# Patient Record
Sex: Female | Born: 1980 | Race: White | Hispanic: No | Marital: Single | Smoking: Current every day smoker
Health system: Southern US, Community
[De-identification: ages and names within clinical notes are randomized; demographics above are authoritative.]

## PROBLEM LIST (undated history)

## (undated) DIAGNOSIS — R7303 Prediabetes: Secondary | ICD-10-CM

## (undated) DIAGNOSIS — E785 Hyperlipidemia, unspecified: Secondary | ICD-10-CM

## (undated) DIAGNOSIS — I10 Essential (primary) hypertension: Secondary | ICD-10-CM

## (undated) DIAGNOSIS — I219 Acute myocardial infarction, unspecified: Secondary | ICD-10-CM

## (undated) HISTORY — PX: OTHER SURGICAL HISTORY: SHX169

---

## 2007-11-28 ENCOUNTER — Emergency Department: Payer: Self-pay | Admitting: Emergency Medicine

## 2008-02-10 ENCOUNTER — Emergency Department: Payer: Self-pay | Admitting: Emergency Medicine

## 2008-04-26 ENCOUNTER — Emergency Department: Payer: Self-pay | Admitting: Emergency Medicine

## 2008-09-08 ENCOUNTER — Emergency Department: Payer: Self-pay | Admitting: Emergency Medicine

## 2010-12-28 ENCOUNTER — Emergency Department: Payer: Self-pay | Admitting: Emergency Medicine

## 2011-02-14 ENCOUNTER — Emergency Department: Payer: Self-pay | Admitting: Emergency Medicine

## 2012-09-10 ENCOUNTER — Ambulatory Visit: Payer: Self-pay | Admitting: Emergency Medicine

## 2013-02-15 DIAGNOSIS — N63 Unspecified lump in unspecified breast: Secondary | ICD-10-CM | POA: Insufficient documentation

## 2013-02-15 DIAGNOSIS — Z720 Tobacco use: Secondary | ICD-10-CM | POA: Insufficient documentation

## 2013-04-05 DIAGNOSIS — Z30430 Encounter for insertion of intrauterine contraceptive device: Secondary | ICD-10-CM | POA: Insufficient documentation

## 2013-11-08 ENCOUNTER — Emergency Department: Payer: Self-pay | Admitting: Emergency Medicine

## 2015-10-15 DIAGNOSIS — E782 Mixed hyperlipidemia: Secondary | ICD-10-CM | POA: Insufficient documentation

## 2016-06-08 ENCOUNTER — Emergency Department: Payer: No Typology Code available for payment source

## 2016-06-08 ENCOUNTER — Emergency Department
Admission: EM | Admit: 2016-06-08 | Discharge: 2016-06-09 | Disposition: A | Discharge status: Home / Self Care | Payer: No Typology Code available for payment source | Attending: Emergency Medicine | Admitting: Emergency Medicine

## 2016-06-08 ENCOUNTER — Encounter: Payer: Self-pay | Admitting: Emergency Medicine

## 2016-06-08 DIAGNOSIS — Y999 Unspecified external cause status: Secondary | ICD-10-CM | POA: Diagnosis not present

## 2016-06-08 DIAGNOSIS — S199XXA Unspecified injury of neck, initial encounter: Secondary | ICD-10-CM | POA: Diagnosis present

## 2016-06-08 DIAGNOSIS — M542 Cervicalgia: Secondary | ICD-10-CM | POA: Diagnosis not present

## 2016-06-08 DIAGNOSIS — Y939 Activity, unspecified: Secondary | ICD-10-CM | POA: Diagnosis not present

## 2016-06-08 DIAGNOSIS — M549 Dorsalgia, unspecified: Secondary | ICD-10-CM | POA: Insufficient documentation

## 2016-06-08 DIAGNOSIS — Y92410 Unspecified street and highway as the place of occurrence of the external cause: Secondary | ICD-10-CM | POA: Insufficient documentation

## 2016-06-08 MED ORDER — ACETAMINOPHEN 325 MG PO TABS
650.0000 mg | ORAL_TABLET | Freq: Once | ORAL | Status: AC
  Administered 2016-06-08: 650 mg via ORAL
  Filled 2016-06-08: qty 2
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
## 2016-06-09 MED ORDER — CYCLOBENZAPRINE HCL 5 MG PO TABS: 5.0000 mg | ORAL_TABLET | Freq: Three times a day (TID) | ORAL | 0 refills | Status: AC | PRN

## 2016-06-09 MED ORDER — MELOXICAM 7.5 MG PO TABS: 7.5000 mg | ORAL_TABLET | Freq: Every day | ORAL | 1 refills | Status: AC
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
## 2017-07-04 ENCOUNTER — Emergency Department
Admission: EM | Admit: 2017-07-04 | Discharge: 2017-07-04 | Disposition: A | Discharge status: Home / Self Care | Payer: BLUE CROSS/BLUE SHIELD | Attending: Student in an Organized Health Care Education/Training Program | Admitting: Student in an Organized Health Care Education/Training Program

## 2017-07-04 ENCOUNTER — Encounter: Payer: Self-pay | Admitting: Medical Oncology

## 2017-07-04 ENCOUNTER — Emergency Department: Payer: BLUE CROSS/BLUE SHIELD

## 2017-07-04 DIAGNOSIS — R1030 Lower abdominal pain, unspecified: Secondary | ICD-10-CM | POA: Diagnosis present

## 2017-07-04 DIAGNOSIS — K529 Noninfective gastroenteritis and colitis, unspecified: Secondary | ICD-10-CM | POA: Diagnosis not present

## 2017-07-04 DIAGNOSIS — R55 Syncope and collapse: Secondary | ICD-10-CM | POA: Diagnosis not present

## 2017-07-04 LAB — COMPREHENSIVE METABOLIC PANEL
ALBUMIN: 4.6 g/dL (ref 3.5–5.0)
ALT: 15 U/L (ref 14–54)
ANION GAP: 6 (ref 5–15)
AST: 20 U/L (ref 15–41)
Alkaline Phosphatase: 53 U/L (ref 38–126)
BUN: 12 mg/dL (ref 6–20)
CALCIUM: 8.9 mg/dL (ref 8.9–10.3)
CO2: 25 mmol/L (ref 22–32)
Chloride: 103 mmol/L (ref 101–111)
Creatinine, Ser: 0.83 mg/dL (ref 0.44–1.00)
GFR calc Af Amer: >60 mL/min (ref >60)
GFR calc non Af Amer: >60 mL/min (ref >60)
GLUCOSE: 147 mg/dL — AB (ref 65–99)
POTASSIUM: 4.1 mmol/L (ref 3.5–5.1)
SODIUM: 134 mmol/L — AB (ref 135–145)
TOTAL PROTEIN: 7.4 g/dL (ref 6.5–8.1)
Total Bilirubin: 0.5 mg/dL (ref 0.3–1.2)

## 2017-07-04 LAB — URINALYSIS, COMPLETE (UACMP) WITH MICROSCOPIC
BILIRUBIN URINE: NEGATIVE
Glucose, UA: NEGATIVE mg/dL
Hgb urine dipstick: NEGATIVE
KETONES UR: NEGATIVE mg/dL
Leukocytes, UA: NEGATIVE
NITRITE: NEGATIVE
PH: 6 (ref 5.0–8.0)
Protein, ur: NEGATIVE mg/dL
Specific Gravity, Urine: 1.011 (ref 1.005–1.030)

## 2017-07-04 LAB — CBC
HCT: 41 % (ref 35.0–47.0)
HEMOGLOBIN: 14.1 g/dL (ref 12.0–16.0)
MCH: 30.8 pg (ref 26.0–34.0)
MCHC: 34.3 g/dL (ref 32.0–36.0)
MCV: 89.8 fL (ref 80.0–100.0)
Platelets: 233 10*3/uL (ref 150–440)
RBC: 4.57 MIL/uL (ref 3.80–5.20)
RDW: 15.3 % — ABNORMAL HIGH (ref 11.5–14.5)
WBC: 11.8 10*3/uL — ABNORMAL HIGH (ref 3.6–11.0)

## 2017-07-04 LAB — POCT PREGNANCY, URINE: PREG TEST UR: NEGATIVE

## 2017-07-04 LAB — LIPASE, BLOOD: Lipase: 32 U/L (ref 11–51)

## 2017-07-04 MED ORDER — HYDROCODONE-ACETAMINOPHEN 5-325 MG PO TABS: 1.0000 | ORAL_TABLET | ORAL | 0 refills | Status: DC | PRN

## 2017-07-04 MED ORDER — SODIUM CHLORIDE 0.9 % IV BOLUS
1000.0000 mL | Freq: Once | INTRAVENOUS | Status: AC
  Administered 2017-07-04: 1000 mL via INTRAVENOUS

## 2017-07-04 MED ORDER — IOPAMIDOL (ISOVUE-300) INJECTION 61%
100.0000 mL | Freq: Once | INTRAVENOUS | Status: AC | PRN
  Administered 2017-07-04: 100 mL via INTRAVENOUS
  Filled 2017-07-04: qty 100

## 2017-07-04 MED ORDER — PROMETHAZINE HCL 25 MG/ML IJ SOLN
12.5000 mg | Freq: Four times a day (QID) | INTRAMUSCULAR | Status: DC | PRN
  Administered 2017-07-04: 12.5 mg via INTRAVENOUS
  Filled 2017-07-04: qty 1

## 2017-07-04 MED ORDER — PROMETHAZINE HCL 12.5 MG PO TABS: 12.5000 mg | ORAL_TABLET | Freq: Four times a day (QID) | ORAL | 0 refills | Status: DC | PRN

## 2017-07-04 MED ORDER — MORPHINE SULFATE (PF) 4 MG/ML IV SOLN
4.0000 mg | INTRAVENOUS | Status: DC | PRN
  Administered 2017-07-04: 4 mg via INTRAVENOUS
  Filled 2017-07-04: qty 1

## 2017-07-04 MED ORDER — CIPROFLOXACIN HCL 500 MG PO TABS: 500.0000 mg | ORAL_TABLET | Freq: Two times a day (BID) | ORAL | 0 refills | Status: AC

## 2017-07-04 MED ORDER — METRONIDAZOLE 500 MG PO TABS: 500.0000 mg | ORAL_TABLET | Freq: Three times a day (TID) | ORAL | 0 refills | Status: AC

## 2017-07-04 NOTE — ED Triage Notes (Signed)
Pt reports she has been having lower abd pain off and on today with nausea and feeling like she is going to pass out. Pt in NAD at this time.

## 2017-07-04 NOTE — ED Provider Notes (Addendum)
[facility] Emergency Department Provider Note    First MD Initiated Contact with Patient 07/04/17 1801     (approximate)  I have reviewed the triage vital signs and the nursing notes.   HISTORY  Chief Complaint Abdominal Pain and Near Syncope    HPI Wanda Stevens is a 37 y.o. female presents to the ER with chief complaint of lower abdominal pain off and on today associated with nausea and feeling like she was about to pass out.  Patient states that she started having umbilical pain this morning associated with nausea and had to stop work and go lay down on the floor she was feeling faint and lightheaded.  Denies any vaginal discharge or bleeding.  Denies any dysuria.  No diarrhea.  No chest pain or shortness of breath.  Is never had symptoms like this before.  History reviewed. No pertinent past medical history. No family history on file. History reviewed. No pertinent surgical history. There are no active problems to display for this patient.     Prior to Admission medications   Not on File    Allergies Neosporin [neomycin-bacitracin zn-polymyx]    Social History Social History   Tobacco Use  . Smoking status: Not on file  Substance Use Topics  . Alcohol use: Not on file  . Drug use: Not on file    Review of Systems Patient denies headaches, rhinorrhea, blurry vision, numbness, shortness of breath, chest pain, edema, cough, abdominal pain, nausea, vomiting, diarrhea, dysuria, fevers, rashes or hallucinations unless otherwise stated above in HPI. ____________________________________________   PHYSICAL EXAM:  VITAL SIGNS: Vitals:   07/04/17 1637  BP: 120/77  Pulse: (!) 114  Resp: 18  Temp: 98.6 F (37 C)  SpO2: 99%    Constitutional: Alert and oriented. Well appearing and in no acute distress. Eyes: Conjunctivae are normal.  Head: Atraumatic. Nose: No congestion/rhinnorhea. Mouth/Throat: Mucous membranes are moist.     Neck: No stridor. Painless ROM.  Cardiovascular: Normal rate, regular rhythm. Grossly normal heart sounds.  Good peripheral circulation. Respiratory: Normal respiratory effort.  No retractions. Lungs CTAB. Gastrointestinal: Soft with mild suprapubic ttp. No distention. No abdominal bruits. No CVA tenderness. Genitourinary:  Musculoskeletal: No lower extremity tenderness nor edema.  No joint effusions. Neurologic:  Normal speech and language. No gross focal neurologic deficits are appreciated. No facial droop Skin:  Skin is warm, dry and intact. No rash noted. Psychiatric: Mood and affect are normal. Speech and behavior are normal.  ____________________________________________   LABS (all labs ordered are listed, but only abnormal results are displayed)  Results for orders placed or performed during the hospital encounter of 07/04/17 (from the past 24 hour(s))  Lipase, blood     Status: None   Collection Time: 07/04/17  4:45 PM  Result Value Ref Range   Lipase 32 11 - 51 U/L  Comprehensive metabolic panel     Status: Abnormal   Collection Time: 07/04/17  4:45 PM  Result Value Ref Range   Sodium 134 (L) 135 - 145 mmol/L   Potassium 4.1 3.5 - 5.1 mmol/L   Chloride 103 101 - 111 mmol/L   CO2 25 22 - 32 mmol/L   Glucose, Bld 147 (H) 65 - 99 mg/dL   BUN 12 6 - 20 mg/dL   Creatinine, Ser 1.61 0.44 - 1.00 mg/dL   Calcium 8.9 8.9 - 09.6 mg/dL   Total Protein 7.4 6.5 - 8.1 g/dL   Albumin 4.6 3.5 - 5.0 g/dL  AST 20 15 - 41 U/L   ALT 15 14 - 54 U/L   Alkaline Phosphatase 53 38 - 126 U/L   Total Bilirubin 0.5 0.3 - 1.2 mg/dL   GFR calc non Af Amer >60 >60 mL/min   GFR calc Af Amer >60 >60 mL/min   Anion gap 6 5 - 15  CBC     Status: Abnormal   Collection Time: 07/04/17  4:45 PM  Result Value Ref Range   WBC 11.8 (H) 3.6 - 11.0 K/uL   RBC 4.57 3.80 - 5.20 MIL/uL   Hemoglobin 14.1 12.0 - 16.0 g/dL   HCT 16.1 09.6 - 04.5 %   MCV 89.8 80.0 - 100.0 fL   MCH 30.8 26.0 - 34.0 pg    MCHC 34.3 32.0 - 36.0 g/dL   RDW 40.9 (H) 81.1 - 91.4 %   Platelets 233 150 - 440 K/uL  Urinalysis, Complete w Microscopic     Status: Abnormal   Collection Time: 07/04/17  4:45 PM  Result Value Ref Range   Color, Urine YELLOW (A) YELLOW   APPearance CLEAR (A) CLEAR   Specific Gravity, Urine 1.011 1.005 - 1.030   pH 6.0 5.0 - 8.0   Glucose, UA NEGATIVE NEGATIVE mg/dL   Hgb urine dipstick NEGATIVE NEGATIVE   Bilirubin Urine NEGATIVE NEGATIVE   Ketones, ur NEGATIVE NEGATIVE mg/dL   Protein, ur NEGATIVE NEGATIVE mg/dL   Nitrite NEGATIVE NEGATIVE   Leukocytes, UA NEGATIVE NEGATIVE   RBC / HPF 0-5 0 - 5 RBC/hpf   Squamous Epithelial / LPF 0-5 (A) NONE SEEN   Mucus PRESENT   Pregnancy, urine POC     Status: None   Collection Time: 07/04/17  5:02 PM  Result Value Ref Range   Preg Test, Ur NEGATIVE NEGATIVE   ____________________________________________ED ECG REPORT I, Willy Eddy, the attending physician, personally viewed and interpreted this ECG.   Date: 07/04/2017  EKG Time: 16:42  Rate: 100  Rhythm: sinus  Axis: normal  Intervals:normal intervals, n  ST&T Change: nonspecific t wave abn  ____________________________________________  RADIOLOGY  I personally reviewed all radiographic images ordered to evaluate for the above acute complaints and reviewed radiology reports and findings.  These findings were personally discussed with the patient.  Please see medical record for radiology report.  ____________________________________________   PROCEDURES  Procedure(s) performed:  Procedures    Critical Care performed: no ____________________________________________   INITIAL IMPRESSION / ASSESSMENT AND PLAN / ED COURSE  Pertinent labs & imaging results that were available during my care of the patient were reviewed by me and considered in my medical decision making (see chart for details).  DDX: appendicitis, gastritis, gastroenteritis, uti, ibs, sbo  Wanda Stevens is a 37 y.o. who presents to the ED with symptoms as described above.  Blood work shows mild leukocytosis and given her tachycardia patient had CT imaging ordered to evaluate for the above differential.  CT imaging shows no evidence of acute appendicitis but is concerning for possible IBD or component of infectious, colitis.  Did discuss case with Dr. Daleen Squibb on call for GI who has recommended Dr. Allegra Lai as a follow-up as she is an IBD specialist.  Patient's repeat abdominal exam is soft and benign.  She is tolerating oral hydration and feels improved after symptomatic management here in the ER.  I do believe that she is stable and appropriate for outpatient follow-up.  We discussed strict return precautions.      As part of my  medical decision making, I reviewed the following data within the electronic MEDICAL RECORD NUMBER Nursing notes reviewed and incorporated, Labs reviewed, notes from prior ED visits.   ____________________________________________   FINAL CLINICAL IMPRESSION(S) / ED DIAGNOSES  Final diagnoses:  Colitis  Near syncope      NEW MEDICATIONS STARTED DURING THIS VISIT:  New Prescriptions   No medications on file     Note:  This document was prepared using Dragon voice recognition software and may include unintentional dictation errors.    Willy Eddyobinson, Carl Butner, MD 07/04/17 2050    Willy Eddyobinson, Cristyn Crossno, MD 07/15/17 1102

## 2017-07-04 NOTE — Discharge Instructions (Addendum)
Call Dr. Verdis PrimeVanga's office to schedule an appointment.  Return to the ER or seek medical attention if you have worsening pain, fevers or inability to keep food or water down.  You have been seen in the emergency department for emergency care. It is important that you contact your own doctor, specialist or the closest clinic for follow-up care. Please bring this instruction sheet, all medications and X-ray copies with you when you are seen for follow-up care.  Determining the exact cause for all patients with abdominal pain is extremely difficult in the emergency department. Our primary focus is to rule-out immediate life-threatening diseases. If no immediate source of pain is found the definitive diagnosis frequently needs to be determined over time.Many times your primary care physician can determine the cause by following the symptoms over time. Sometimes, specialist are required such as Gastroenterologists, Gynecologists, Urologists or Surgeons. Please return immediately to the Emergency Department for fever>101, Vomiting or Intractable Pain. You should return to the emergency department or see your primary care provider in 12-24hrs if your pain is no better and sooner if your pain becomes worse.

## 2017-07-05 ENCOUNTER — Telehealth: Payer: Self-pay | Admitting: Gastroenterology

## 2017-07-05 NOTE — Telephone Encounter (Signed)
Left vm for pt to call office and schedule ED Fu this week with Dr. Allegra LaiVanga

## 2017-07-08 ENCOUNTER — Other Ambulatory Visit: Payer: Self-pay

## 2017-07-08 ENCOUNTER — Ambulatory Visit (INDEPENDENT_AMBULATORY_CARE_PROVIDER_SITE_OTHER): Payer: BLUE CROSS/BLUE SHIELD | Admitting: Gastroenterology

## 2017-07-08 ENCOUNTER — Encounter: Payer: Self-pay | Admitting: Gastroenterology

## 2017-07-08 VITALS — BP 123/87 | HR 87 | Ht 64.0 in | Wt 208.8 lb

## 2017-07-08 DIAGNOSIS — T8332XA Displacement of intrauterine contraceptive device, initial encounter: Secondary | ICD-10-CM | POA: Insufficient documentation

## 2017-07-08 DIAGNOSIS — K639 Disease of intestine, unspecified: Secondary | ICD-10-CM | POA: Diagnosis not present

## 2017-07-08 DIAGNOSIS — R197 Diarrhea, unspecified: Secondary | ICD-10-CM | POA: Diagnosis not present

## 2017-07-08 NOTE — Progress Notes (Signed)
Arlyss Repressohini R Belen Zwahlen, MD [address]  [address]  [place], [place] [address]  Main: [phone]  Fax: [phone]    Gastroenterology Consultation  Referring Provider:     Jerrilyn Cairo[place], [facility]* Primary Care Physician:  Jerrilyn Cairo[place], [facility] Primary Gastroenterologist:  Dr. Arlyss Repressohini R Rajohn Henery Reason for Consultation:     Colitis on CT scan        HPI:   Hipolito BayleyValerie R Zaucha is a 37 y.o. female referred by Dr. Dan Humph[place], [facility]  for consultation & management of and colitis on CT scan. Patient went to ER on 07/04/2017 secondary to severe onset of lower abdominal pain associated with nausea and presyncope. She reports that she has intermittent episodes of loose bowel movements especially during menstrual cycle. Otherwise, she reports that she is generally constipated.she underwent CT A/P in the ER which revealed thickening of the cecum and ascending colon. She she had mild leukocytosis. Serum lipase and CMP are unremarkable. She denies weight loss, loss of appetite, bloating. She reports having ongoing mild lower abdominal discomfort. Able to tolerate by mouth well. She admits to drinking carbonated beverages or sweet tea, eats red meat regularly. She denies fever, chills  she was given Cipro and Flagyl in the ER, she has not started medication yet because of the medication cost  NSAIDs: ibuprofen occasionally for headaches  Antiplts/Anticoagulants/Anti thrombotics: none  GI Procedures: none She denies family history of GI malignancy, inflammatory bowel disease She denies any abdominal surgeries  History reviewed. No pertinent past medical history.  History reviewed. No pertinent surgical history.   Current Outpatient Medications:  .  ciprofloxacin (CIPRO) 500 MG tablet, Take 1 tablet (500 mg total) by mouth 2 (two) times daily for 10 days. (Patient not taking: Reported on 07/08/2017), Disp: 20 tablet, Rfl: 0 .  COPPER PO, by Intrauterine route., Disp: , Rfl:  .  diazepam  (VALIUM) 5 MG tablet, Take by mouth., Disp: , Rfl:  .  HYDROcodone-acetaminophen (NORCO) 5-325 MG tablet, Take 1 tablet by mouth every 4 (four) hours as needed for moderate pain. (Patient not taking: Reported on 07/08/2017), Disp: 6 tablet, Rfl: 0 .  metroNIDAZOLE (FLAGYL) 500 MG tablet, Take 1 tablet (500 mg total) by mouth 3 (three) times daily for 7 days. (Patient not taking: Reported on 07/08/2017), Disp: 21 tablet, Rfl: 0 .  oseltamivir (TAMIFLU) 75 MG capsule, , Disp: , Rfl:  .  promethazine (PHENERGAN) 12.5 MG tablet, Take 1 tablet (12.5 mg total) by mouth every 6 (six) hours as needed for nausea or vomiting. (Patient not taking: Reported on 07/08/2017), Disp: 12 tablet, Rfl: 0   History reviewed. No pertinent family history.   Social History   Tobacco Use  . Smoking status: Current Every Day Smoker  . Smokeless tobacco: Never Used  Substance Use Topics  . Alcohol use: Yes    Comment: seldom  . Drug use: Not on file    Allergies as of 07/08/2017 - Review Complete 07/08/2017  Allergen Reaction Noted  . Other  02/15/2013  . Neomycin-bacitracin zn-polymyx Rash 02/15/2013  . Orange oil Rash 02/15/2013    Review of Systems:    All systems reviewed and negative except where noted in HPI.   Physical Exam:  BP 123/87   Pulse 87   Ht 5\' 4"  (1.626 m)   Wt 208 lb 12.8 oz (94.7 kg)   LMP 06/10/2017 (Exact Date)   BMI 35.84 kg/m  Patient's last menstrual period was 06/10/2017 (exact date).  General:   Alert,  Well-developed, well-nourished, pleasant and cooperative in NAD Head:  Normocephalic and atraumatic. Eyes:  Sclera clear, no icterus.   Conjunctiva pink. Ears:  Normal auditory acuity. Nose:  No deformity, discharge, or lesions. Mouth:  No deformity or lesions,oropharynx pink & moist. Neck:  Supple; no masses or thyromegaly. Lungs:  Respirations even and unlabored.  Clear throughout to auscultation.   No wheezes, crackles, or rhonchi. No acute distress. Heart:  Regular  rate and rhythm; no murmurs, clicks, rubs, or gallops. Abdomen:  Normal bowel sounds. Soft, mild lower abdominal tenderness and non-distended without masses, hepatosplenomegaly or hernias noted.  No guarding or rebound tenderness.   Rectal: Not performed Msk:  Symmetrical without gross deformities. Good, equal movement & strength bilaterally. Pulses:  Normal pulses noted. Extremities:  No clubbing or edema.  No cyanosis. Neurologic:  Alert and oriented x3;  grossly normal neurologically. Skin:  Intact without significant lesions or rashes. No jaundice. Psych:  Alert and cooperative. Normal mood and affect.  Imaging Studies: eviewed  Assessment and Plan:   ROSINA CRESSLER is a 37 y.o. Caucasian female with no significant past medical history or past surgical history presents with approximately 1 week history of lower abdominal pain associated with nausea and loose stools. She does have mild leukocytosis and thickening of the right colon on recent CT scan.   - Rule out infectious diarrhea, check stool studies - Advised her to stop drinking carbonated beverages, sweet tea, cut back on red meat - High fiber diet - Endoscopic evaluation based on the above workup and patient's clinical progress   Follow up in 2 weeks   Arlyss Repress, MD

## 2017-07-08 NOTE — Patient Instructions (Addendum)
1. [facility] Stool Labs.  Label each container with name, dob, time collected, date collected.  [facility] stool [facility] within 24 hours.  2.Begin High Fiber Diet.  3. Trial sample of IBG to treat symptoms.  4. Follow up in 2 weeks. High-Fiber Diet Fiber, also called dietary fiber, is a type of carbohydrate found in fruits, vegetables, whole grains, and beans. A high-fiber diet can have many health benefits. Your health care provider may recommend a high-fiber diet to help:  Prevent constipation. Fiber can make your bowel movements more regular.  Lower your cholesterol.  Relieve hemorrhoids, uncomplicated diverticulosis, or irritable bowel syndrome.  Prevent overeating as part of a weight-loss plan.  Prevent heart disease, type 2 diabetes, and certain cancers.  What is my plan? The recommended daily intake of fiber includes:  38 grams for men under age 36.  30 grams for men over age 36.  25 grams for women under age 350.  21 grams for women over age 11050.  You can get the recommended daily intake of dietary fiber by eating a variety of fruits, vegetables, grains, and beans. Your health care provider may also recommend a fiber supplement if it is not possible to get enough fiber through your diet. What do I need to know about a high-fiber diet?  Fiber supplements have not been widely studied for their effectiveness, so it is better to get fiber through food sources.  Always check the fiber content on thenutrition facts label of any prepackaged food. Look for foods that contain at least 5 grams of fiber per serving.  Ask your dietitian if you have questions about specific foods that are related to your condition, especially if those foods are not listed in the following section.  Increase your daily fiber consumption gradually. Increasing your intake of dietary fiber too quickly may cause bloating, cramping, or gas.  Drink plenty of water. Water helps you to digest  fiber. What foods can I eat? Grains Whole-grain breads. Multigrain cereal. Oats and oatmeal. Brown rice. Barley. Bulgur wheat. Millet. Bran muffins. Popcorn. Rye wafer crackers. Vegetables Sweet potatoes. Spinach. Kale. Artichokes. Cabbage. Broccoli. Green peas. Carrots. Squash. Fruits Berries. Pears. Apples. Oranges. Avocados. Prunes and raisins. Dried figs. Meats and Other Protein Sources Navy, kidney, pinto, and soy beans. Split peas. Lentils. Nuts and seeds. Dairy Fiber-fortified yogurt. Beverages Fiber-fortified soy milk. Fiber-fortified orange juice. Other Fiber bars. The items listed above may not be a complete list of recommended foods or beverages. Contact your dietitian for more options. What foods are not recommended? Grains White bread. Pasta made with refined flour. White rice. Vegetables Fried potatoes. Canned vegetables. Well-cooked vegetables. Fruits Fruit juice. Cooked, strained fruit. Meats and Other Protein Sources Fatty cuts of meat. Fried Environmental education officerpoultry or fried fish. Dairy Milk. Yogurt. Cream cheese. Sour cream. Beverages Soft drinks. Other Cakes and pastries. Butter and oils. The items listed above may not be a complete list of foods and beverages to avoid. Contact your dietitian for more information. What are some tips for including high-fiber foods in my diet?  Eat a wide variety of high-fiber foods.  Make sure that half of all grains consumed each day are whole grains.  Replace breads and cereals made from refined flour or white flour with whole-grain breads and cereals.  Replace white rice with brown rice, bulgur wheat, or millet.  Start the day with a breakfast that is high in fiber, such as a cereal that contains at least 5 grams of fiber per  serving.  Use beans in place of meat in soups, salads, or pasta.  Eat high-fiber snacks, such as berries, raw vegetables, nuts, or popcorn. This information is not intended to replace advice given to you by  your health care provider. Make sure you discuss any questions you have with your health care provider. Document Released: 03/01/2005 Document Revised: 08/07/2015 Document Reviewed: 08/14/2013 Elsevier Interactive Patient Education  [employer].

## 2017-07-11 ENCOUNTER — Other Ambulatory Visit: Payer: Self-pay

## 2017-07-14 ENCOUNTER — Other Ambulatory Visit
Admission: RE | Admit: 2017-07-14 | Payer: BLUE CROSS/BLUE SHIELD | Source: Ambulatory Visit | Admitting: Gastroenterology

## 2017-07-14 ENCOUNTER — Telehealth: Payer: Self-pay | Admitting: Gastroenterology

## 2017-07-14 NOTE — Telephone Encounter (Signed)
Lab called stating pt needs to return and add date off birth to stool sample. Left vm to inform pt

## 2017-07-14 NOTE — Telephone Encounter (Signed)
Spoke with Jaynie Collins and patient regarding missing identifier.  Patient will go back to the lab tomorrow morning around 9am to write her date of birth on stool specimen.  Jaynie Collins said he will let the other lab staff know that she is coming.  Thanks [employer]

## 2017-07-14 NOTE — Telephone Encounter (Signed)
Pt called back stating she is unable to return to Lab to write DOB on stool sample  Please call pt to advise further instructions

## 2017-07-15 ENCOUNTER — Other Ambulatory Visit
Admission: RE | Admit: 2017-07-15 | Discharge: 2017-07-15 | Disposition: A | Discharge status: Home / Self Care | Payer: BLUE CROSS/BLUE SHIELD | Source: Ambulatory Visit | Attending: Gastroenterology | Admitting: Gastroenterology

## 2017-07-15 ENCOUNTER — Other Ambulatory Visit: Payer: Self-pay

## 2017-07-15 ENCOUNTER — Telehealth: Payer: Self-pay

## 2017-07-15 DIAGNOSIS — R197 Diarrhea, unspecified: Secondary | ICD-10-CM | POA: Diagnosis present

## 2017-07-15 LAB — GASTROINTESTINAL PANEL BY PCR, STOOL (REPLACES STOOL CULTURE)
Adenovirus F40/41: NOT DETECTED
Astrovirus: NOT DETECTED
CRYPTOSPORIDIUM: NOT DETECTED
CYCLOSPORA CAYETANENSIS: NOT DETECTED
Campylobacter species: NOT DETECTED
ENTAMOEBA HISTOLYTICA: NOT DETECTED
Enteroaggregative E coli (EAEC): NOT DETECTED
Enteropathogenic E coli (EPEC): DETECTED — AB
Enterotoxigenic E coli (ETEC): NOT DETECTED
Giardia lamblia: NOT DETECTED
Norovirus GI/GII: NOT DETECTED
Plesimonas shigelloides: NOT DETECTED
Rotavirus A: NOT DETECTED
SALMONELLA SPECIES: NOT DETECTED
SAPOVIRUS (I, II, IV, AND V): NOT DETECTED
SHIGA LIKE TOXIN PRODUCING E COLI (STEC): NOT DETECTED
SHIGELLA/ENTEROINVASIVE E COLI (EIEC): NOT DETECTED
VIBRIO CHOLERAE: NOT DETECTED
VIBRIO SPECIES: NOT DETECTED
Yersinia enterocolitica: NOT DETECTED

## 2017-07-15 LAB — C DIFFICILE QUICK SCREEN W PCR REFLEX
C DIFFICLE (CDIFF) ANTIGEN: NEGATIVE
C Diff interpretation: NOT DETECTED
C Diff toxin: NEGATIVE

## 2017-07-15 MED ORDER — CIPROFLOXACIN HCL 500 MG PO TABS: 500.0000 mg | ORAL_TABLET | Freq: Two times a day (BID) | ORAL | 0 refills | Status: AC

## 2017-07-15 NOTE — Telephone Encounter (Signed)
Patient has been informed she has E-Coli.  Has been asked to begin taking cipro 500 mg twice a day for 7 days. Preventive measures discussed included properly cooking hamburger, washing produce before eating and proper handwashing.  Thanks [employer]

## 2017-07-15 NOTE — Telephone Encounter (Signed)
-----   Message from Toney Reil, MD sent at 07/15/2017 12:45 PM EDT ----- Recommend cipro  PO BID for 7days  -RV

## 2017-07-25 ENCOUNTER — Ambulatory Visit: Payer: BLUE CROSS/BLUE SHIELD | Admitting: Gastroenterology

## 2018-09-16 IMAGING — CT CT ABD-PELV W/ CM
2 of 4 series · 16 of 46 positions shown, 18 images · IV contrast (APPLIED)
Comparison: None.

CLINICAL DATA: Lower abdominal pain and nausea.

EXAM:
CT ABDOMEN AND PELVIS WITH CONTRAST
TECHNIQUE: Multidetector CT imaging of the abdomen and pelvis was performed
using the standard protocol following bolus administration of
intravenous contrast.
CONTRAST:  100mL Y3XJN5-JNN IOPAMIDOL (Y3XJN5-JNN) INJECTION 61%

[Series 2: axial st · axial · 0.80mm/px · z∈[-494,-49]mm · 13 of 99 slices shown, 15 images]
[im 5/99  soft-tissue]
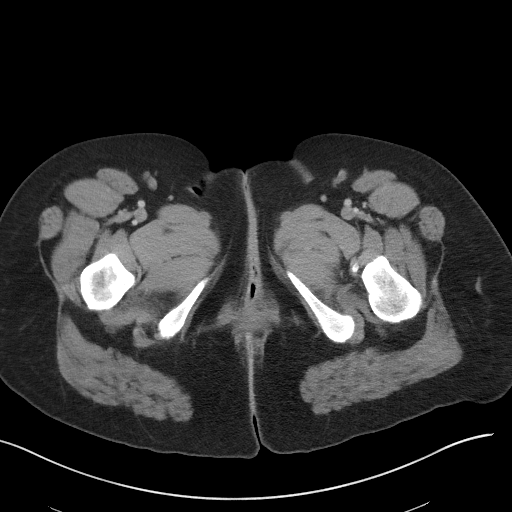
[im 5/99  bone]
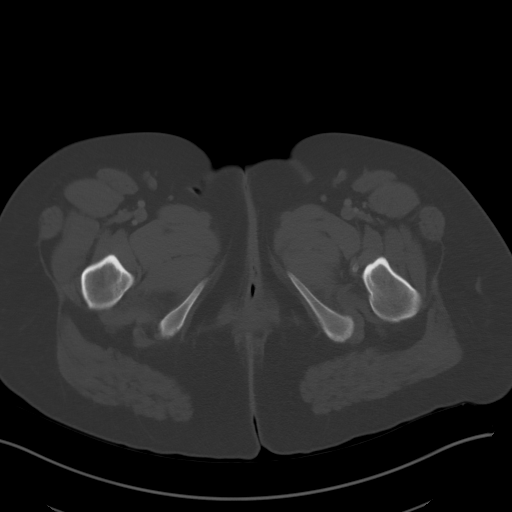
[im 13/99  soft-tissue]
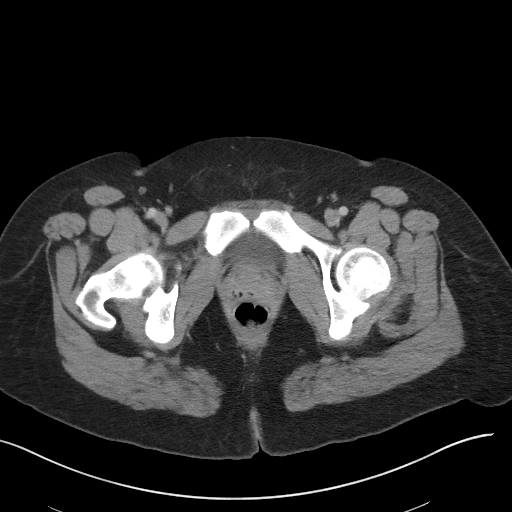
[im 21/99  soft-tissue]
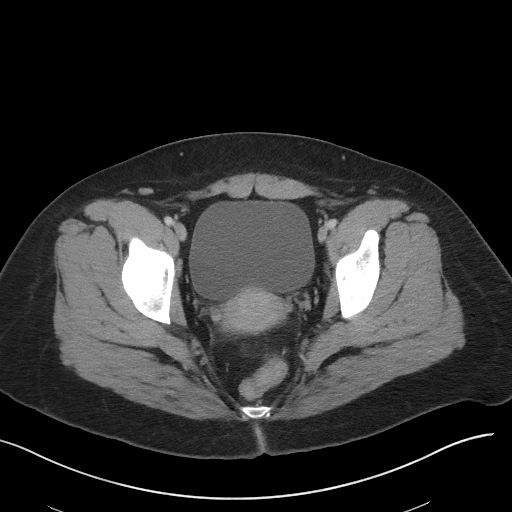
[im 29/99  soft-tissue]
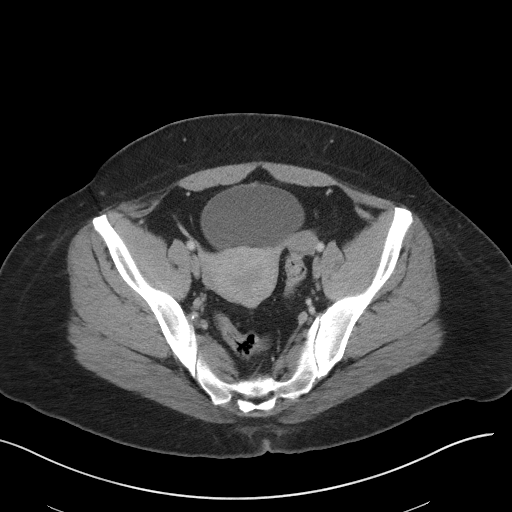
[im 33/99  soft-tissue]
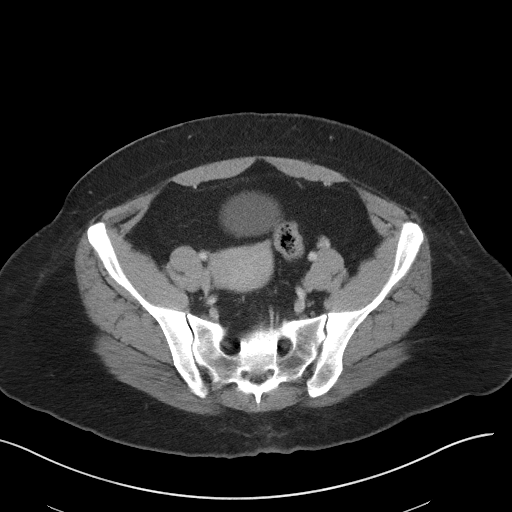
[im 41/99  soft-tissue]
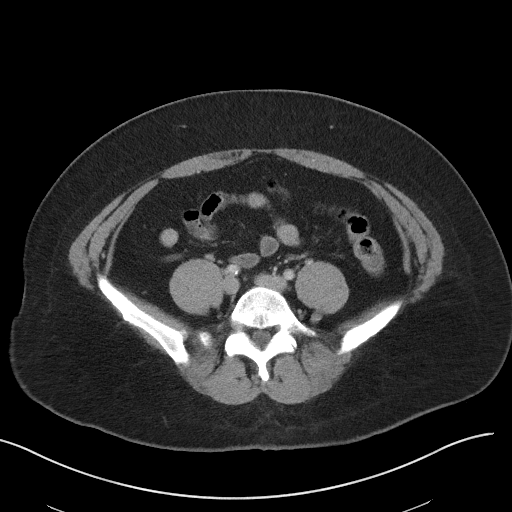
[im 50/99  soft-tissue]
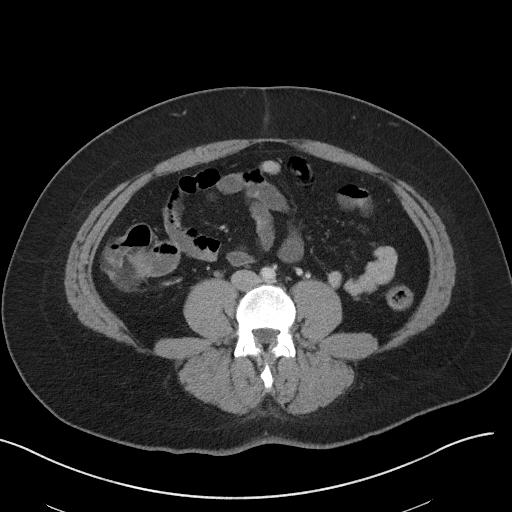
[im 58/99  soft-tissue]
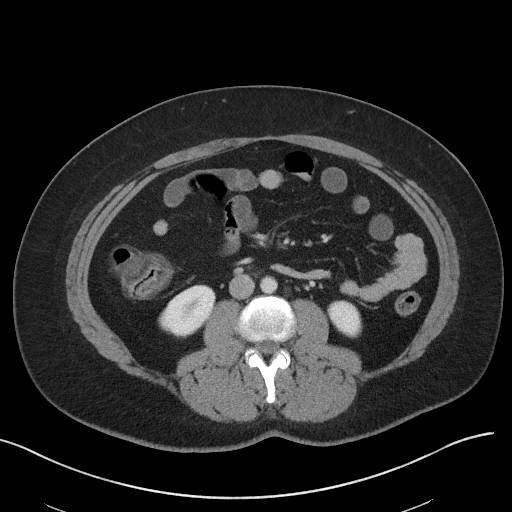
[im 66/99  soft-tissue]
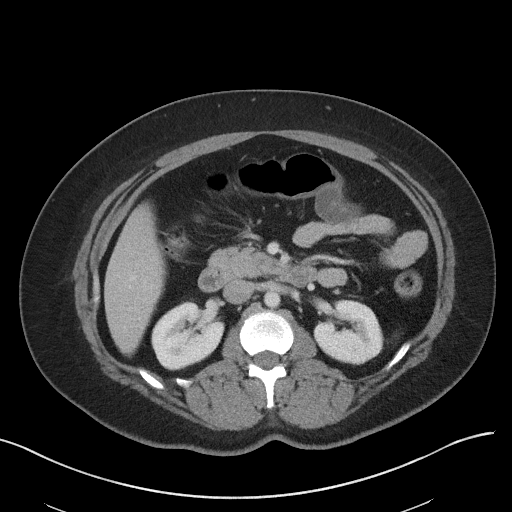
[im 66/99  bone]
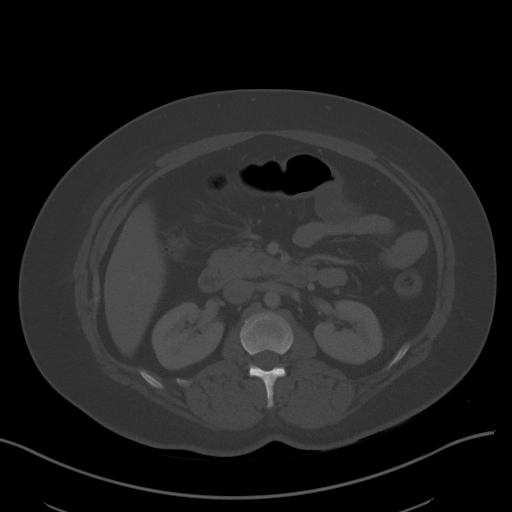
[im 70/99  soft-tissue]
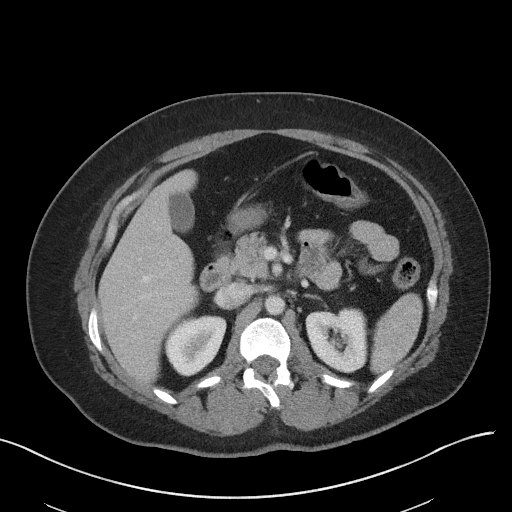
[im 78/99  soft-tissue]
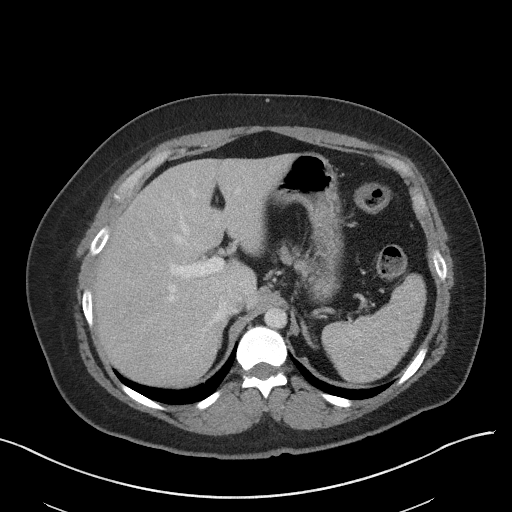
[im 86/99  soft-tissue]
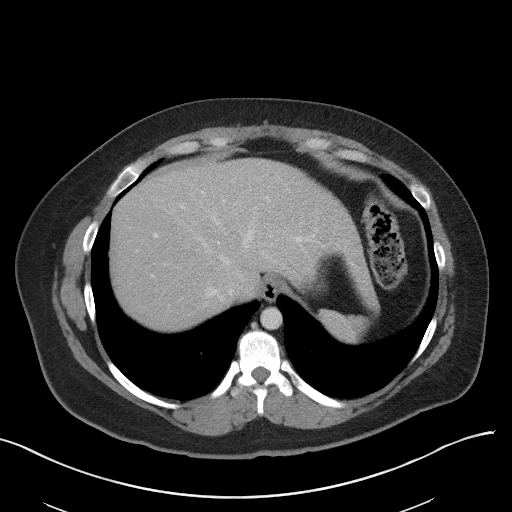
[im 94/99  soft-tissue]
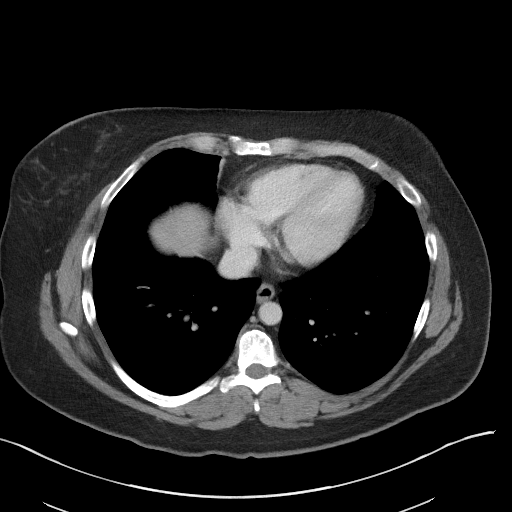

[Series 5: coronal st · coronal · 0.78mm/px · 3 of 101 slices shown]
[im 34/101  soft-tissue]
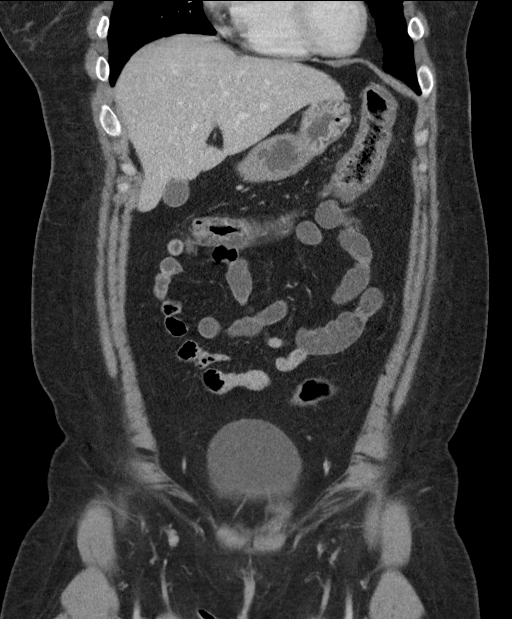
[im 45/101  soft-tissue]
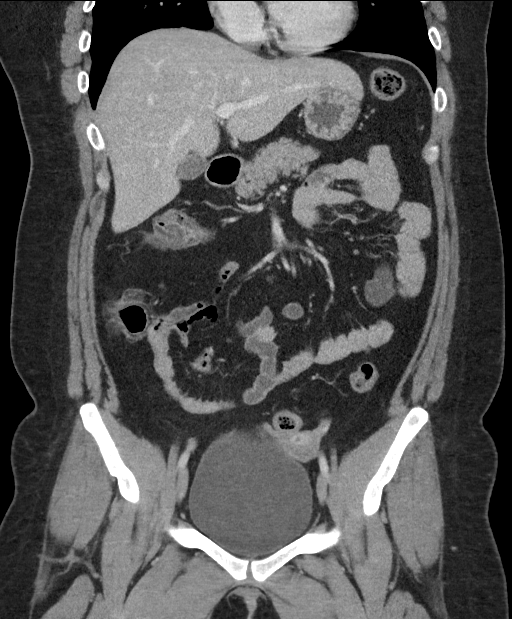
[im 56/101  soft-tissue]
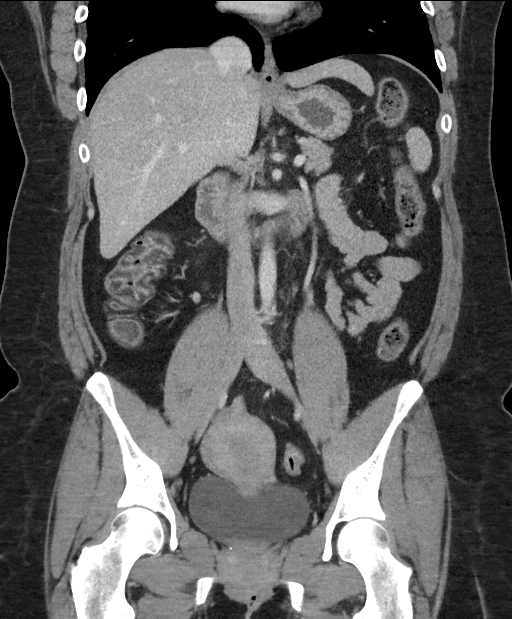

[16 of 46 positions shown; findings below may reference images not displayed]

FINDINGS: Lower chest: No acute abnormality.

Hepatobiliary: No focal liver abnormality is seen. No gallstones,
gallbladder wall thickening, or biliary dilatation.

Pancreas: Unremarkable. No pancreatic ductal dilatation or
surrounding inflammatory changes.

Spleen: Normal in size without focal abnormality.

Adrenals/Urinary Tract: Adrenal glands are unremarkable. Kidneys are
normal, without renal calculi, focal lesion, or hydronephrosis.
Bladder is unremarkable.

Stomach/Bowel: There is suggestion of wall thickening involving the
cecum and ascending colon up to the level of the hepatic flexure
with circumferential submucosal fat. No significant surrounding
acute inflammatory changes are seen in the pericolonic fat. Findings
are nonspecific but can be seen with chronic inflammatory conditions
such as inflammatory bowel disease or other types of colitis. The
terminal ileum does not appear involved and the appendix is normal.
No free air, abscess or bowel obstruction.

Vascular/Lymphatic: No significant vascular findings are present. No
enlarged abdominal or pelvic lymph nodes.

Reproductive: Uterus and bilateral adnexa are unremarkable.

Other: No abdominal wall hernia or abnormality. No abdominopelvic
ascites.

Musculoskeletal: No acute or significant osseous findings.
IMPRESSION: Nonspecific wall thickening and circumferential submucosal fat
within the ascending colon beginning at the level of the cecum and
extending up through the hepatic flexure. This can be associated
with chronic inflammatory conditions. Recommend correlation with any
chronic gastrointestinal symptoms/complaints.

## 2019-01-10 ENCOUNTER — Other Ambulatory Visit: Payer: Self-pay

## 2019-01-10 ENCOUNTER — Encounter
Admission: EM | Disposition: A | Discharge status: Home / Self Care | Payer: Self-pay | Source: Home / Self Care | Attending: Internal Medicine

## 2019-01-10 ENCOUNTER — Encounter: Payer: Self-pay | Admitting: Internal Medicine

## 2019-01-10 ENCOUNTER — Inpatient Hospital Stay
Admission: EM | Admit: 2019-01-10 | Discharge: 2019-01-12 | DRG: 247 | Disposition: A | Discharge status: Home / Self Care | Payer: Medicaid Other | Attending: Internal Medicine | Admitting: Internal Medicine

## 2019-01-10 DIAGNOSIS — I10 Essential (primary) hypertension: Secondary | ICD-10-CM | POA: Diagnosis present

## 2019-01-10 DIAGNOSIS — E785 Hyperlipidemia, unspecified: Secondary | ICD-10-CM | POA: Diagnosis present

## 2019-01-10 DIAGNOSIS — F17210 Nicotine dependence, cigarettes, uncomplicated: Secondary | ICD-10-CM | POA: Diagnosis present

## 2019-01-10 DIAGNOSIS — Z8249 Family history of ischemic heart disease and other diseases of the circulatory system: Secondary | ICD-10-CM

## 2019-01-10 DIAGNOSIS — E119 Type 2 diabetes mellitus without complications: Secondary | ICD-10-CM | POA: Diagnosis present

## 2019-01-10 DIAGNOSIS — E669 Obesity, unspecified: Secondary | ICD-10-CM | POA: Diagnosis present

## 2019-01-10 DIAGNOSIS — I2510 Atherosclerotic heart disease of native coronary artery without angina pectoris: Secondary | ICD-10-CM | POA: Diagnosis present

## 2019-01-10 DIAGNOSIS — Z79899 Other long term (current) drug therapy: Secondary | ICD-10-CM

## 2019-01-10 DIAGNOSIS — Z91048 Other nonmedicinal substance allergy status: Secondary | ICD-10-CM | POA: Diagnosis not present

## 2019-01-10 DIAGNOSIS — Z8349 Family history of other endocrine, nutritional and metabolic diseases: Secondary | ICD-10-CM | POA: Diagnosis not present

## 2019-01-10 DIAGNOSIS — R079 Chest pain, unspecified: Secondary | ICD-10-CM

## 2019-01-10 DIAGNOSIS — Z20828 Contact with and (suspected) exposure to other viral communicable diseases: Secondary | ICD-10-CM | POA: Diagnosis present

## 2019-01-10 DIAGNOSIS — I2119 ST elevation (STEMI) myocardial infarction involving other coronary artery of inferior wall: Secondary | ICD-10-CM | POA: Diagnosis present

## 2019-01-10 DIAGNOSIS — Z955 Presence of coronary angioplasty implant and graft: Secondary | ICD-10-CM

## 2019-01-10 DIAGNOSIS — Z881 Allergy status to other antibiotic agents status: Secondary | ICD-10-CM | POA: Diagnosis not present

## 2019-01-10 DIAGNOSIS — Z833 Family history of diabetes mellitus: Secondary | ICD-10-CM

## 2019-01-10 HISTORY — PX: CORONARY STENT INTERVENTION: CATH118234

## 2019-01-10 HISTORY — PX: CORONARY/GRAFT ACUTE MI REVASCULARIZATION: CATH118305

## 2019-01-10 HISTORY — DX: Essential (primary) hypertension: I10

## 2019-01-10 HISTORY — PX: LEFT HEART CATH AND CORONARY ANGIOGRAPHY: CATH118249

## 2019-01-10 HISTORY — DX: Hyperlipidemia, unspecified: E78.5

## 2019-01-10 HISTORY — DX: Prediabetes: R73.03

## 2019-01-10 LAB — CBC WITH DIFFERENTIAL/PLATELET
Abs Immature Granulocytes: 0.03 10*3/uL (ref 0.00–0.07)
Basophils Absolute: 0.1 10*3/uL (ref 0.0–0.1)
Basophils Relative: 1 %
Eosinophils Absolute: 0.1 10*3/uL (ref 0.0–0.5)
Eosinophils Relative: 1 %
HCT: 41.7 % (ref 36.0–46.0)
Hemoglobin: 13.8 g/dL (ref 12.0–15.0)
Immature Granulocytes: 0 %
Lymphocytes Relative: 40 %
Lymphs Abs: 4.3 10*3/uL — ABNORMAL HIGH (ref 0.7–4.0)
MCH: 29.5 pg (ref 26.0–34.0)
MCHC: 33.1 g/dL (ref 30.0–36.0)
MCV: 89.1 fL (ref 80.0–100.0)
Monocytes Absolute: 0.8 10*3/uL (ref 0.1–1.0)
Monocytes Relative: 8 %
Neutro Abs: 5.4 10*3/uL (ref 1.7–7.7)
Neutrophils Relative %: 50 %
Platelets: 297 10*3/uL (ref 150–400)
RBC: 4.68 MIL/uL (ref 3.87–5.11)
RDW: 14.7 % (ref 11.5–15.5)
Smear Review: NORMAL
WBC: 10.7 10*3/uL — ABNORMAL HIGH (ref 4.0–10.5)
nRBC: 0 % (ref 0.0–0.2)

## 2019-01-10 LAB — GLUCOSE, CAPILLARY
Glucose-Capillary: 93 mg/dL (ref 70–99)
Glucose-Capillary: 96 mg/dL (ref 70–99)

## 2019-01-10 LAB — BASIC METABOLIC PANEL
Anion gap: 14 (ref 5–15)
BUN: 9 mg/dL (ref 6–20)
CO2: 20 mmol/L — ABNORMAL LOW (ref 22–32)
Calcium: 9.3 mg/dL (ref 8.9–10.3)
Chloride: 101 mmol/L (ref 98–111)
Creatinine, Ser: 0.9 mg/dL (ref 0.44–1.00)
GFR calc Af Amer: >60 mL/min (ref >60)
GFR calc non Af Amer: >60 mL/min (ref >60)
Glucose, Bld: 135 mg/dL — ABNORMAL HIGH (ref 70–99)
Potassium: 3.4 mmol/L — ABNORMAL LOW (ref 3.5–5.1)
Sodium: 135 mmol/L (ref 135–145)

## 2019-01-10 LAB — MRSA PCR SCREENING: MRSA by PCR: NEGATIVE

## 2019-01-10 LAB — TROPONIN I (HIGH SENSITIVITY)
Troponin I (High Sensitivity): 12 ng/L (ref <18)
Troponin I (High Sensitivity): 7049 ng/L (ref <18)

## 2019-01-10 LAB — POCT ACTIVATED CLOTTING TIME
Activated Clotting Time: 263 seconds
Activated Clotting Time: 279 seconds

## 2019-01-10 LAB — HEMOGLOBIN A1C
Hgb A1c MFr Bld: 5.5 % (ref 4.8–5.6)
Mean Plasma Glucose: 111.15 mg/dL

## 2019-01-10 LAB — PROTIME-INR
INR: 1.1 (ref 0.8–1.2)
Prothrombin Time: 13.8 seconds (ref 11.4–15.2)

## 2019-01-10 LAB — SARS CORONAVIRUS 2 BY RT PCR (HOSPITAL ORDER, PERFORMED IN ~~LOC~~ HOSPITAL LAB): SARS Coronavirus 2: NEGATIVE

## 2019-01-10 SURGERY — CORONARY/GRAFT ACUTE MI REVASCULARIZATION
Anesthesia: Moderate Sedation

## 2019-01-10 MED ORDER — HEPARIN SODIUM (PORCINE) 1000 UNIT/ML IJ SOLN
INTRAMUSCULAR | Status: DC | PRN
  Administered 2019-01-10: 3000 [IU] via INTRAVENOUS
  Administered 2019-01-10: 6000 [IU] via INTRAVENOUS
  Administered 2019-01-10: 3000 [IU] via INTRAVENOUS

## 2019-01-10 MED ORDER — SODIUM CHLORIDE 0.9 % WEIGHT BASED INFUSION
1.0000 mL/kg/h | INTRAVENOUS | Status: AC
  Administered 2019-01-11: 1 mL/kg/h via INTRAVENOUS

## 2019-01-10 MED ORDER — SODIUM CHLORIDE 0.9% FLUSH
3.0000 mL | Freq: Two times a day (BID) | INTRAVENOUS | Status: DC
  Administered 2019-01-11 – 2019-01-12 (×2): 3 mL via INTRAVENOUS

## 2019-01-10 MED ORDER — LISINOPRIL 5 MG PO TABS
2.5000 mg | ORAL_TABLET | Freq: Every day | ORAL | Status: DC
  Administered 2019-01-11 – 2019-01-12 (×2): 2.5 mg via ORAL
  Filled 2019-01-10 (×2): qty 1

## 2019-01-10 MED ORDER — VERAPAMIL HCL 2.5 MG/ML IV SOLN
INTRAVENOUS | Status: DC | PRN
  Administered 2019-01-10 (×3): 2.5 mg via INTRA_ARTERIAL

## 2019-01-10 MED ORDER — ACETAMINOPHEN 325 MG PO TABS
650.0000 mg | ORAL_TABLET | ORAL | Status: DC | PRN
  Administered 2019-01-10 – 2019-01-12 (×4): 650 mg via ORAL
  Filled 2019-01-10 (×4): qty 2

## 2019-01-10 MED ORDER — SODIUM CHLORIDE 0.9 % IV BOLUS
1000.0000 mL | Freq: Once | INTRAVENOUS | Status: AC
  Administered 2019-01-10: 15:00:00 1000 mL via INTRAVENOUS

## 2019-01-10 MED ORDER — INSULIN ASPART 100 UNIT/ML ~~LOC~~ SOLN: 0.0000 [IU] | Freq: Every day | SUBCUTANEOUS | Status: DC

## 2019-01-10 MED ORDER — TICAGRELOR 90 MG PO TABS
ORAL_TABLET | ORAL | Status: AC
  Filled 2019-01-10: qty 2

## 2019-01-10 MED ORDER — TICAGRELOR 90 MG PO TABS
ORAL_TABLET | ORAL | Status: DC | PRN
  Administered 2019-01-10: 180 mg via ORAL

## 2019-01-10 MED ORDER — FENTANYL CITRATE (PF) 100 MCG/2ML IJ SOLN
INTRAMUSCULAR | Status: AC
  Filled 2019-01-10: qty 2

## 2019-01-10 MED ORDER — TICAGRELOR 90 MG PO TABS
90.0000 mg | ORAL_TABLET | Freq: Two times a day (BID) | ORAL | Status: DC
  Administered 2019-01-10 – 2019-01-12 (×4): 90 mg via ORAL
  Filled 2019-01-10 (×4): qty 1

## 2019-01-10 MED ORDER — METOPROLOL SUCCINATE ER 25 MG PO TB24
12.5000 mg | ORAL_TABLET | Freq: Every day | ORAL | Status: DC
  Administered 2019-01-10 – 2019-01-11 (×2): 12.5 mg via ORAL
  Filled 2019-01-10 (×2): qty 0.5

## 2019-01-10 MED ORDER — NITROGLYCERIN 1 MG/10 ML FOR IR/CATH LAB
INTRA_ARTERIAL | Status: AC
  Filled 2019-01-10: qty 10

## 2019-01-10 MED ORDER — MIDAZOLAM HCL 2 MG/2ML IJ SOLN
INTRAMUSCULAR | Status: DC | PRN
  Administered 2019-01-10 (×2): 1 mg via INTRAVENOUS

## 2019-01-10 MED ORDER — NITROGLYCERIN 1 MG/10 ML FOR IR/CATH LAB
INTRA_ARTERIAL | Status: DC | PRN
  Administered 2019-01-10 (×3): 200 ug via INTRACORONARY

## 2019-01-10 MED ORDER — SODIUM CHLORIDE 0.9% FLUSH: 3.0000 mL | INTRAVENOUS | Status: DC | PRN

## 2019-01-10 MED ORDER — SODIUM CHLORIDE 0.9 % WEIGHT BASED INFUSION
1.0000 mL/kg/h | INTRAVENOUS | Status: DC
  Administered 2019-01-10: 1 mL/kg/h via INTRAVENOUS

## 2019-01-10 MED ORDER — HYDRALAZINE HCL 20 MG/ML IJ SOLN: 10.0000 mg | INTRAMUSCULAR | Status: AC | PRN

## 2019-01-10 MED ORDER — ATORVASTATIN CALCIUM 80 MG PO TABS
80.0000 mg | ORAL_TABLET | Freq: Every day | ORAL | Status: DC
  Administered 2019-01-10 – 2019-01-11 (×2): 80 mg via ORAL
  Filled 2019-01-10: qty 1
  Filled 2019-01-10 (×2): qty 4
  Filled 2019-01-10: qty 1

## 2019-01-10 MED ORDER — HEPARIN SODIUM (PORCINE) 1000 UNIT/ML IJ SOLN
INTRAMUSCULAR | Status: AC
  Filled 2019-01-10: qty 1

## 2019-01-10 MED ORDER — NICOTINE 14 MG/24HR TD PT24
14.0000 mg | MEDICATED_PATCH | Freq: Every day | TRANSDERMAL | Status: DC
  Administered 2019-01-10 – 2019-01-12 (×3): 14 mg via TRANSDERMAL
  Filled 2019-01-10 (×3): qty 1

## 2019-01-10 MED ORDER — FENTANYL CITRATE (PF) 100 MCG/2ML IJ SOLN
INTRAMUSCULAR | Status: DC | PRN
  Administered 2019-01-10: 25 ug via INTRAVENOUS

## 2019-01-10 MED ORDER — BIVALIRUDIN TRIFLUOROACETATE 250 MG IV SOLR
INTRAVENOUS | Status: AC
  Filled 2019-01-10: qty 250

## 2019-01-10 MED ORDER — ASPIRIN 81 MG PO CHEW
81.0000 mg | CHEWABLE_TABLET | Freq: Every day | ORAL | Status: DC
  Administered 2019-01-10 – 2019-01-12 (×3): 81 mg via ORAL
  Filled 2019-01-10 (×3): qty 1

## 2019-01-10 MED ORDER — VERAPAMIL HCL 2.5 MG/ML IV SOLN
INTRAVENOUS | Status: AC
  Filled 2019-01-10: qty 2

## 2019-01-10 MED ORDER — INSULIN ASPART 100 UNIT/ML ~~LOC~~ SOLN: 0.0000 [IU] | Freq: Three times a day (TID) | SUBCUTANEOUS | Status: DC

## 2019-01-10 MED ORDER — SODIUM CHLORIDE 0.9 % IV SOLN: 250.0000 mL | INTRAVENOUS | Status: DC | PRN

## 2019-01-10 MED ORDER — ONDANSETRON HCL 4 MG/2ML IJ SOLN: 4.0000 mg | Freq: Four times a day (QID) | INTRAMUSCULAR | Status: DC | PRN

## 2019-01-10 MED ORDER — HEPARIN (PORCINE) IN NACL 1000-0.9 UT/500ML-% IV SOLN
INTRAVENOUS | Status: AC
  Filled 2019-01-10: qty 1000

## 2019-01-10 MED ORDER — IOHEXOL 300 MG/ML  SOLN
INTRAMUSCULAR | Status: DC | PRN
  Administered 2019-01-10: 200 mL

## 2019-01-10 MED ORDER — NITROGLYCERIN 0.4 MG SL SUBL: 0.4000 mg | SUBLINGUAL_TABLET | SUBLINGUAL | Status: DC | PRN

## 2019-01-10 MED ORDER — HEPARIN (PORCINE) IN NACL 1000-0.9 UT/500ML-% IV SOLN
INTRAVENOUS | Status: DC | PRN
  Administered 2019-01-10 (×2): 500 mL

## 2019-01-10 MED ORDER — LABETALOL HCL 5 MG/ML IV SOLN: 10.0000 mg | INTRAVENOUS | Status: AC | PRN

## 2019-01-10 MED ORDER — MIDAZOLAM HCL 2 MG/2ML IJ SOLN
INTRAMUSCULAR | Status: AC
  Filled 2019-01-10: qty 2

## 2019-01-10 SURGICAL SUPPLY — 16 items
BALLN TREK RX 2.25X12 (BALLOONS) ×2
BALLOON TREK RX 2.25X12 (BALLOONS) ×1 IMPLANT
CATH 5F 110X4 TIG (CATHETERS) ×2 IMPLANT
CATH INFINITI 5 FR JL3.5 (CATHETERS) ×2 IMPLANT
CATH INFINITI 5FR ANG PIGTAIL (CATHETERS) ×2 IMPLANT
CATH VISTA GUIDE 6FR JR4 (CATHETERS) ×2 IMPLANT
CATH VISTA GUIDE 6FR JR4 SH (CATHETERS) ×2 IMPLANT
DEVICE INFLAT 30 PLUS (MISCELLANEOUS) ×2 IMPLANT
DEVICE RAD TR BAND REGULAR (VASCULAR PRODUCTS) ×2 IMPLANT
GLIDESHEATH SLEND SS 6F .021 (SHEATH) ×2 IMPLANT
KIT MANI 3VAL PERCEP (MISCELLANEOUS) ×2 IMPLANT
PACK CARDIAC CATH (CUSTOM PROCEDURE TRAY) ×2 IMPLANT
STENT RESOLUTE ONYX 2.5X15 (Permanent Stent) ×2 IMPLANT
WIRE ASAHI PROWATER 180CM (WIRE) ×2 IMPLANT
WIRE HITORQ VERSACORE ST 145CM (WIRE) ×2 IMPLANT
WIRE ROSEN-J .035X260CM (WIRE) ×4 IMPLANT

## 2019-01-10 NOTE — ED Triage Notes (Signed)
PT to ED via EMS with STEMI on Cardiac Monitor. Pt talking to staff but reporting worsening chest pain at 1350 today 10/10 pain  At onset. EMS administered 73mcg Fentanyl and 324mg  ASA pain currently 7/10.

## 2019-01-10 NOTE — Consult Note (Signed)
[facility] Cardiology  CARDIOLOGY CONSULT NOTE  Patient ID: RYLEY TEATER MRN: [id number] DOB/AGE: May 01, 1980 38 y.o.  Admit date: 01/10/2019 Referring Physician Joni Fears Primary Physician [facility] Primary Cardiologist  Reason for Consultation inferior ST elevation myocardial infarction  HPI: The patient is a 38 year old female with newly diagnosed diabetes and history of tobacco abuse.  She was in her usual state of health until 1:50 PM today when she developed 10 out of 10 substernal chest pain.  She presented to [facility] ED where ECG revealed diagnostic ST elevations in inferior leads consistent with inferior ST elevation myocardial infarction.  Review of systems complete and found to be negative unless listed above     No past medical history on file.  No past surgical history on file.  Medications Prior to Admission  Medication Sig Dispense Refill Last Dose  . COPPER PO by Intrauterine route.     . diazepam (VALIUM) 5 MG tablet Take by mouth.   Completed Course at Unknown time  . HYDROcodone-acetaminophen (NORCO) 5-325 MG tablet Take 1 tablet by mouth every 4 (four) hours as needed for moderate pain. (Patient not taking: Reported on 07/08/2017) 6 tablet 0   . oseltamivir (TAMIFLU) 75 MG capsule    Completed Course at Unknown time  . promethazine (PHENERGAN) 12.5 MG tablet Take 1 tablet (12.5 mg total) by mouth every 6 (six) hours as needed for nausea or vomiting. (Patient not taking: Reported on 07/08/2017) 12 tablet 0    Social History   Socioeconomic History  . Marital status: Single    Spouse name: Not on file  . Number of children: Not on file  . Years of education: Not on file  . Highest education level: Not on file  Occupational History  . Not on file  Social Needs  . Financial resource strain: Not on file  . Food insecurity    Worry: Not on file    Inability: Not on file  . Transportation needs    Medical: Not on file    Non-medical: Not on file  Tobacco Use  .  Smoking status: Current Every Day Smoker  . Smokeless tobacco: Never Used  Substance and Sexual Activity  . Alcohol use: Yes    Comment: seldom  . Drug use: Not on file  . Sexual activity: Not on file  Lifestyle  . Physical activity    Days per week: Not on file    Minutes per session: Not on file  . Stress: Not on file  Relationships  . Social Herbalist on phone: Not on file    Gets together: Not on file    Attends religious service: Not on file    Active member of club or organization: Not on file    Attends meetings of clubs or organizations: Not on file    Relationship status: Not on file  . Intimate partner violence    Fear of current or ex partner: Not on file    Emotionally abused: Not on file    Physically abused: Not on file    Forced sexual activity: Not on file  Other Topics Concern  . Not on file  Social History Narrative  . Not on file    No family history on file.    Review of systems complete and found to be negative unless listed above      PHYSICAL EXAM  General: Well developed, well nourished, in no acute distress HEENT:  Normocephalic and atramatic Neck:  No JVD.  Lungs: Clear bilaterally to auscultation and percussion. Heart: HRRR . Normal S1 and S2 without gallops or murmurs.  Abdomen: Bowel sounds are positive, abdomen soft and non-tender  Msk:  Back normal, normal gait. Normal strength and tone for age. Extremities: No clubbing, cyanosis or edema.   Neuro: Alert and oriented X 3. Psych:  Good affect, responds appropriately  Labs:   Lab Results  Component Value Date   WBC 10.7 (H) 01/10/2019   HGB 13.8 01/10/2019   HCT 41.7 01/10/2019   MCV 89.1 01/10/2019   PLT 297 01/10/2019    Recent Labs  Lab 01/10/19 1501  NA 135  K 3.4*  CL 101  CO2 20*  BUN 9  CREATININE 0.90  CALCIUM 9.3  GLUCOSE 135*   No results found for: CKTOTAL, CKMB, CKMBINDEX, TROPONINI No results found for: CHOL No results found for: HDL No  results found for: LDLCALC No results found for: TRIG No results found for: CHOLHDL No results found for: LDLDIRECT    Radiology: No results found.  EKG: 1 to 2 mm ST elevations in leads II, III and aVF  ASSESSMENT AND PLAN:   1.  Inferior ST elevation myocardial infarction  Recommendations  1.  Emergent cardiac catheterization and probable primary PCI  Signed: Marcina Millard MD,PhD, [facility] 01/10/2019, 4:40 PM

## 2019-01-10 NOTE — H&P (Signed)
[facility] [place] [facility]   PATIENT NAME: Wanda Stevens    MR#:  [id number]  DATE OF BIRTH:  15-May-1980  DATE OF ADMISSION:  01/10/2019  PRIMARY CARE PHYSICIAN: Dan Humphreys, [facility]   REQUESTING/REFERRING PHYSICIAN: Dr Marcina Millard  CHIEF COMPLAINT:  Chest pain  HISTORY OF PRESENT ILLNESS:  Wanda Stevens  is a 38 y.o. female presents to the hospital with severe crushing chest pain.  It felt like 1000 elephant stepping on her.  Also a burning component.  It lasted until he gave her medications in the emergency room.  Nothing made it better besides those medications and rubbing a little bit on her chest.  It was associated with nausea, sweating, shortness of breath and right arm numbness.  In the ER she was found to have a STEMI in the inferior leads.  She was taken to the cardiac Cath Lab and a stent was placed in the RCA.  Hospitalist services contacted for admission  PAST MEDICAL HISTORY:   Past Medical History:  Diagnosis Date  . Hyperlipidemia   . Hypertension   . Pre-diabetes     PAST SURGICAL HISTORY:   Past Surgical History:  Procedure Laterality Date  . wisdome teeth      SOCIAL HISTORY:   Social History   Tobacco Use  . Smoking status: Current Every Day Smoker    Packs/day: 0.50  . Smokeless tobacco: Never Used  Substance Use Topics  . Alcohol use: Yes    Comment: seldom    FAMILY HISTORY:   Family History  Problem Relation Age of Onset  . Diabetes Mother   . Hyperlipidemia Mother   . CAD Father   . Hypertension Father   . Diabetes Father   . Hyperlipidemia Father     DRUG ALLERGIES:   Allergies  Allergen Reactions  . Other     Other reaction(s): HIVES  . Neomycin-Bacitracin Zn-Polymyx Rash    Other reaction(s): OTHER  . Orange Oil Rash    Other reaction(s): HIVES    REVIEW OF SYSTEMS:  CONSTITUTIONAL: No fever.positive for sweating.  Some fatigue.  EYES: No blurred or double vision.   EARS, NOSE, AND THROAT:  No sore throat RESPIRATORY: No cough.  Positive for shortness of breath.  CARDIOVASCULAR: Positive for chest pain.  GASTROINTESTINAL: Positive for nausea.  No vomiting, diarrhea or abdominal pain. No blood in bowel movements GENITOURINARY: No dysuria, hematuria.  ENDOCRINE: No polyuria, nocturia,  HEMATOLOGY: No anemia, easy bruising or bleeding SKIN: No rash or lesion. MUSCULOSKELETAL: Positive for right arm numbness NEUROLOGIC: Positive for right arm numbness PSYCHIATRY: No anxiety or depression.   MEDICATIONS AT HOME:   Prior to Admission medications   Not on File   Patient states she takes Metformin 500 mg daily and cabergoline 0.5 mg twice a week.  VITAL SIGNS:  Blood pressure (!) 159/107, pulse 77, temperature 98.6 F (37 C), temperature source Oral, resp. rate 20, SpO2 100 %.  PHYSICAL EXAMINATION:  GENERAL:  38 y.o.-year-old patient lying in the bed with no acute distress.  EYES: Pupils equal, round, reactive to light and accommodation. No scleral icterus. Extraocular muscles intact.  HEENT: Head atraumatic, normocephalic. Oropharynx and nasopharynx clear.  NECK:  Supple, no jugular venous distention. No thyroid enlargement, no tenderness.  LUNGS: Normal breath sounds bilaterally, no wheezing, rales,rhonchi or crepitation. No use of accessory muscles of respiration.  CARDIOVASCULAR: S1, S2 normal. No murmurs, rubs, or gallops.  ABDOMEN: Soft, nontender, nondistended. Bowel sounds present.  No organomegaly or mass.  EXTREMITIES: No pedal edema, cyanosis, or clubbing.  NEUROLOGIC: Cranial nerves II through XII are intact. Muscle strength 5/5 in all extremities. Sensation intact. Gait not checked.  PSYCHIATRIC: The patient is alert and oriented x 3.  SKIN: No rash, lesion, or ulcer.   LABORATORY PANEL:   CBC Recent Labs  Lab 01/10/19 1501  WBC 10.7*  HGB 13.8  HCT 41.7  PLT 297    ------------------------------------------------------------------------------------------------------------------  Chemistries  Recent Labs  Lab 01/10/19 1501  NA 135  K 3.4*  CL 101  CO2 20*  GLUCOSE 135*  BUN 9  CREATININE 0.90  CALCIUM 9.3   ------------------------------------------------------------------------------------------------------------------  Cardiac Enzymes First troponin 12   EKG:   STEMI lead II, III and aVF  IMPRESSION AND PLAN:   1.  Acute STEMI inferior leads with chest pain and shortness of breath.  Patient was brought urgently to the cardiac Cath Lab by Dr. Lorinda Creed and a stent was placed in the RCA.  Patient placed on aspirin, Brilinta and atorvastatin.  Echocardiogram ordered. 2.  Hypertension on presentation likely secondary to pain.  Hopefully can start ACE inhibitor and low-dose beta-blocker.  Most recent blood pressure 115/86. 3.  Hyperlipidemia check a lipid profile tomorrow morning and started on high intensity statin. 4.  Prediabetes as per patient we will send off a hemoglobin A1c and place on sliding scale insulin.  Hold Metformin since the patient had a cardiac cath. 5. Tobacco abuse.  Smoking cessation counseling done 4 minutes by me.  Nicotine patch ordered.  All the records are reviewed and case discussed with ED provider. Management plans discussed with the patient, family and they are in agreement.  CODE STATUS: Full code  TOTAL TIME TAKING CARE OF THIS PATIENT: 50 minutes.    Loletha Grayer M.D on 01/10/2019 at 5:19 PM  Between 7am to 6pm - Pager - [phone]  After 6pm call admission pager [phone]  [facility] [facility]  [phone]  CC: Primary care physician; [facility]

## 2019-01-10 NOTE — ED Provider Notes (Signed)
[facility] Emergency Department Provider Note  ____________________________________________  Time seen: Approximately 2:59 PM  I have reviewed the triage vital signs and the nursing notes.   HISTORY  Chief Complaint Chest pain   HPI MARAM BENTLY is a 38 y.o. female with a history of diabetes and obesity who comes the ED complaining of chest pain that started at about 1:50 PM today.  Acute onset, nonradiating, 10/10, tightness in the center of the chest.  No aggravating or alleviating factors.  Never had pain like this before.  Constant, now 6/10.  Received 324 of aspirin and 25 mg of IV fentanyl by EMS.      No past medical history on file.   Patient Active Problem List   Diagnosis Date Noted  . IUD strings lost 07/08/2017  . Elevated cholesterol with elevated triglycerides 10/15/2015  . Encounter for IUD insertion 04/05/2013  . Breast lump 02/15/2013  . Tobacco abuse 02/15/2013     No past surgical history on file.   Prior to Admission medications   Medication Sig Start Date End Date Taking? Authorizing Provider  COPPER PO by Intrauterine route. 04/05/13   [provider]  diazepam (VALIUM) 5 MG tablet Take by mouth. 04/08/16   [provider]  HYDROcodone-acetaminophen (NORCO) 5-325 MG tablet Take 1 tablet by mouth every 4 (four) hours as needed for moderate pain. Patient not taking: Reported on 07/08/2017 07/04/17   Willy Eddy, MD  oseltamivir (TAMIFLU) 75 MG capsule  04/30/17   [provider]  promethazine (PHENERGAN) 12.5 MG tablet Take 1 tablet (12.5 mg total) by mouth every 6 (six) hours as needed for nausea or vomiting. Patient not taking: Reported on 07/08/2017 07/04/17   Willy Eddy, MD     Allergies Other, Neomycin-bacitracin zn-polymyx, and Orange oil   No family history on file.  Social History Social History   Tobacco Use  . Smoking status: Current Every Day Smoker  . Smokeless  tobacco: Never Used  Substance Use Topics  . Alcohol use: Yes    Comment: seldom  . Drug use: Not on file    Review of Systems  Constitutional:   No fever or chills.  ENT:   No sore throat. No rhinorrhea. Cardiovascular:   Positive as above chest pain without syncope. Respiratory:   No dyspnea or cough. Gastrointestinal:   Negative for abdominal pain, vomiting and diarrhea.  Musculoskeletal:   Negative for focal pain or swelling All other systems reviewed and are negative except as documented above in ROS and HPI.  ____________________________________________   PHYSICAL EXAM:  VITAL SIGNS: ED Triage Vitals [01/10/19 1457]  Enc Vitals Group     BP (!) 156/90     Pulse      Resp      Temp      Temp src      SpO2      Weight      Height      Head Circumference      Peak Flow      Pain Score      Pain Loc      Pain Edu?      Excl. in GC?     Vital signs reviewed, nursing assessments reviewed.   Constitutional:   Alert and oriented. Non-toxic appearance. Eyes:   Conjunctivae are normal. EOMI. PERRL. ENT      Head:   Normocephalic and atraumatic.      Nose:   Wearing a mask.  Mouth/Throat:   Wearing a mask.      Neck:   No meningismus. Full ROM. Hematological/Lymphatic/Immunilogical:   No cervical lymphadenopathy. Cardiovascular:   RRR. Symmetric bilateral radial and DP pulses.  No murmurs. Cap refill less than 2 seconds. Respiratory:   Normal respiratory effort without tachypnea/retractions. Breath sounds are clear and equal bilaterally. No wheezes/rales/rhonchi. Gastrointestinal:   Soft and nontender. Non distended. There is no CVA tenderness.  No rebound, rigidity, or guarding.  Musculoskeletal:   Normal range of motion in all extremities. No joint effusions.  No lower extremity tenderness.  No edema. Neurologic:   Normal speech and language.  Motor grossly intact. No acute focal neurologic deficits are appreciated.  Skin:    Skin is warm, dry and intact.  No rash noted.  No petechiae, purpura, or bullae.  ____________________________________________    LABS (pertinent positives/negatives) (all labs ordered are listed, but only abnormal results are displayed) Labs Reviewed - No data to display ____________________________________________   EKG  Interpreted by me Normal sinus rhythm rate of 98, normal axis and intervals.  ST elevation in 2 3 aVF with reciprocal ST depression in V2 diagnostic of inferior STEMI.  ____________________________________________    RADIOLOGY  No results found.  ____________________________________________   PROCEDURES .Critical Care Performed by: Sharman CheekStafford, Alyaan Budzynski, MD Authorized by: Sharman CheekStafford, Lyncoln Maskell, MD   Critical care provider statement:    Critical care time (minutes):  15   Critical care time was exclusive of:  Separately billable procedures and treating other patients   Critical care was necessary to treat or prevent imminent or life-threatening deterioration of the following conditions:  Cardiac failure   Critical care was time spent personally by me on the following activities:  Development of treatment plan with patient or surrogate, discussions with consultants, evaluation of patient's response to treatment, examination of patient, obtaining history from patient or surrogate, ordering and performing treatments and interventions, ordering and review of laboratory studies, ordering and review of radiographic studies, pulse oximetry, re-evaluation of patient's condition and review of old charts    ____________________________________________    CLINICAL IMPRESSION / ASSESSMENT AND PLAN / ED COURSE  Medications ordered in the ED: Medications - No data to display  Pertinent labs & imaging results that were available during my care of the patient were reviewed by me and considered in my medical decision making (see chart for details).  Hipolito BayleyValerie R Prout was evaluated in Emergency Department on  01/10/2019 for the symptoms described in the history of present illness. She was evaluated in the context of the global COVID-19 pandemic, which necessitated consideration that the patient might be at risk for infection with the SARS-CoV-2 virus that causes COVID-19. Institutional protocols and algorithms that pertain to the evaluation of patients at risk for COVID-19 are in a state of rapid change based on information released by regulatory bodies including the CDC and federal and state organizations. These policies and algorithms were followed during the patient's care in the ED.   Patient presents with chest pain, EKG diagnostic of STEMI.  Cardiology Dr. Ann MakiParrish is at bedside shortly after arrival, plans to proceed to Cath Lab as soon as lab is available.  Hemodynamically stable currently, mental status intact.  Covid swab sent for screening.  Labs sent.  4000 units of IV heparin given in ED.  Further care by cardiology and hospitalist.      ____________________________________________   FINAL CLINICAL IMPRESSION(S) / ED DIAGNOSES    Final diagnoses:  Acute ST elevation myocardial infarction (  STEMI) involving other coronary artery of inferior wall (HCC)  Chest pain, unspecified type     ED Discharge Orders    None      Portions of this note were generated with dragon dictation software. Dictation errors may occur despite best attempts at proofreading.   Carrie Mew, MD 01/10/19 1505

## 2019-01-10 NOTE — ED Notes (Signed)
Cardiologist at bedside.  

## 2019-01-10 NOTE — ED Notes (Signed)
COVID swab performed.

## 2019-01-11 ENCOUNTER — Encounter: Payer: Self-pay | Admitting: Cardiology

## 2019-01-11 ENCOUNTER — Inpatient Hospital Stay
Admit: 2019-01-11 | Discharge: 2019-01-11 | Disposition: A | Discharge status: Home / Self Care | Payer: Medicaid Other | Attending: Internal Medicine | Admitting: Internal Medicine

## 2019-01-11 LAB — ECHOCARDIOGRAM COMPLETE
Height: 64 in
Weight: 3248 oz

## 2019-01-11 LAB — CBC
HCT: 37.7 % (ref 36.0–46.0)
Hemoglobin: 12.4 g/dL (ref 12.0–15.0)
MCH: 29.7 pg (ref 26.0–34.0)
MCHC: 32.9 g/dL (ref 30.0–36.0)
MCV: 90.4 fL (ref 80.0–100.0)
Platelets: 220 10*3/uL (ref 150–400)
RBC: 4.17 MIL/uL (ref 3.87–5.11)
RDW: 14.6 % (ref 11.5–15.5)
WBC: 7.6 10*3/uL (ref 4.0–10.5)
nRBC: 0 % (ref 0.0–0.2)

## 2019-01-11 LAB — BASIC METABOLIC PANEL
Anion gap: 4 — ABNORMAL LOW (ref 5–15)
BUN: 8 mg/dL (ref 6–20)
CO2: 24 mmol/L (ref 22–32)
Calcium: 8.4 mg/dL — ABNORMAL LOW (ref 8.9–10.3)
Chloride: 112 mmol/L — ABNORMAL HIGH (ref 98–111)
Creatinine, Ser: 0.73 mg/dL (ref 0.44–1.00)
GFR calc Af Amer: >60 mL/min (ref >60)
GFR calc non Af Amer: >60 mL/min (ref >60)
Glucose, Bld: 114 mg/dL — ABNORMAL HIGH (ref 70–99)
Potassium: 3.7 mmol/L (ref 3.5–5.1)
Sodium: 140 mmol/L (ref 135–145)

## 2019-01-11 LAB — LIPID PANEL
Cholesterol: 232 mg/dL — ABNORMAL HIGH (ref 0–200)
HDL: 32 mg/dL — ABNORMAL LOW (ref >40)
LDL Cholesterol: 165 mg/dL — ABNORMAL HIGH (ref 0–99)
Total CHOL/HDL Ratio: 7.3 RATIO
Triglycerides: 173 mg/dL — ABNORMAL HIGH (ref <150)
VLDL: 35 mg/dL (ref 0–40)

## 2019-01-11 LAB — GLUCOSE, CAPILLARY
Glucose-Capillary: 106 mg/dL — ABNORMAL HIGH (ref 70–99)
Glucose-Capillary: 82 mg/dL (ref 70–99)
Glucose-Capillary: 110 mg/dL — ABNORMAL HIGH (ref 70–99)
Glucose-Capillary: 117 mg/dL — ABNORMAL HIGH (ref 70–99)

## 2019-01-11 MED ORDER — CHLORHEXIDINE GLUCONATE CLOTH 2 % EX PADS
6.0000 | MEDICATED_PAD | Freq: Every day | CUTANEOUS | Status: DC
  Administered 2019-01-11: 12:00:00 6 via TOPICAL

## 2019-01-11 MED ORDER — ENOXAPARIN SODIUM 40 MG/0.4ML ~~LOC~~ SOLN
40.0000 mg | Freq: Every day | SUBCUTANEOUS | Status: DC
  Administered 2019-01-11 – 2019-01-12 (×2): 40 mg via SUBCUTANEOUS
  Filled 2019-01-11 (×2): qty 0.4

## 2019-01-11 NOTE — Progress Notes (Signed)
[place] [facility] [place] NAME: Wanda Stevens    MR#:  [id number]  DATE OF BIRTH:  Jun 25, 1980  SUBJECTIVE:  CHIEF COMPLAINT: Patient is resting comfortably denies any chest pain but endorses some discomfort  REVIEW OF SYSTEMS:  CONSTITUTIONAL: No fever, fatigue or weakness.  EYES: No blurred or double vision.  EARS, NOSE, AND THROAT: No tinnitus or ear pain.  RESPIRATORY: No cough, shortness of breath, wheezing or hemoptysis.  CARDIOVASCULAR: No chest pain, orthopnea, edema.  GASTROINTESTINAL: No nausea, vomiting, diarrhea or abdominal pain.  GENITOURINARY: No dysuria, hematuria.  ENDOCRINE: No polyuria, nocturia,  HEMATOLOGY: No anemia, easy bruising or bleeding SKIN: No rash or lesion. MUSCULOSKELETAL: No joint pain or arthritis.   NEUROLOGIC: No tingling, numbness, weakness.  PSYCHIATRY: No anxiety or depression.   DRUG ALLERGIES:   Allergies  Allergen Reactions  . Other     Other reaction(s): HIVES  . Neomycin-Bacitracin Zn-Polymyx Rash    Other reaction(s): OTHER  . Orange Oil Rash    Other reaction(s): HIVES    VITALS:  Blood pressure 90/62, pulse 67, temperature 98 F (36.7 C), temperature source Oral, resp. rate 20, height 5\' 4"  (1.626 m), weight 92.1 kg, SpO2 99 %.  PHYSICAL EXAMINATION:  GENERAL:  38 y.o.-year-old patient lying in the bed with no acute distress.  EYES: Pupils equal, round, reactive to light and accommodation. No scleral icterus. Extraocular muscles intact.  HEENT: Head atraumatic, normocephalic. Oropharynx and nasopharynx clear.  NECK:  Supple, no jugular venous distention. No thyroid enlargement, no tenderness.  LUNGS: Normal breath sounds bilaterally, no wheezing, rales,rhonchi or crepitation. No use of accessory muscles of respiration.  CARDIOVASCULAR: S1, S2 normal. No murmurs, rubs, or gallops.  ABDOMEN: Soft, nontender, nondistended. Bowel sounds present. EXTREMITIES: No pedal edema,  cyanosis, or clubbing.  NEUROLOGIC: Cranial nerves II through XII are intact. Muscle strength 5/5 in all extremities. Sensation intact. Gait not checked.  PSYCHIATRIC: The patient is alert and oriented x 3.  SKIN: No obvious rash, lesion, or ulcer.    LABORATORY PANEL:   CBC Recent Labs  Lab 01/11/19 0419  WBC 7.6  HGB 12.4  HCT 37.7  PLT 220   ------------------------------------------------------------------------------------------------------------------  Chemistries  Recent Labs  Lab 01/11/19 0419  NA 140  K 3.7  CL 112*  CO2 24  GLUCOSE 114*  BUN 8  CREATININE 0.73  CALCIUM 8.4*   ------------------------------------------------------------------------------------------------------------------  Cardiac Enzymes No results for input(s): TROPONINI in the last 168 hours. ------------------------------------------------------------------------------------------------------------------  RADIOLOGY:  No results found.  EKG:   Orders placed or performed during the hospital encounter of 01/10/19  . EKG 12-Lead immediately post procedure  . EKG 12-Lead  . EKG 12-Lead immediately post procedure  . EKG 12-Lead  . EKG 12-Lead  . EKG 12-Lead    ASSESSMENT AND PLAN:   1.  Acute STEMI inferior leads with chest pain and shortness of breath.  Patient was code STEMI , taken Cath Lab by Dr. Lorinda Creed and a stent was placed in the RCA.  Patient placed on aspirin, Brilinta and atorvastatin.  Echocardiogram  Continue aspirin, Brilinta, statin, beta-blocker and ACE inhibitor 2.  Hypertension -on beta-blocker and ACE inhibitor titrate as needed 3.  Hyperlipidemia-LDL 165 , started on high intensity statin. 4.  Prediabetes as per patient we will send off a hemoglobin A1c-5.5 and place on sliding scale insulin.  Resume Metformin when patient is clinically stable  5. Tobacco abuse.  Smoking cessation counseling done 4 minutes by  me.  Nicotine patch ordered     All the records  are reviewed and case discussed with Care Management/Social Workerr. Management plans discussed with the patient, family and they are in agreement.  CODE STATUS:  fc   TOTAL TIME TAKING CARE OF THIS PATIENT: 35  minutes.   POSSIBLE D/C IN 1-2  DAYS, DEPENDING ON CLINICAL CONDITION.  Note: This dictation was prepared with Dragon dictation along with smaller phrase technology. Any transcriptional errors that result from this process are unintentional.   Ramonita Lab M.D on 01/11/2019 at 1:26 PM  Between 7am to 6pm - Pager - [phone] After 6pm go to [email or web address] - password [email or web address] [facility]  [place]  [facility]  Office  [phone]  CC: Primary care physician; [facility]

## 2019-01-11 NOTE — Progress Notes (Signed)
*  PRELIMINARY RESULTS* Echocardiogram 2D Echocardiogram has been performed.  Wanda Stevens 01/11/2019, 8:26 AM

## 2019-01-11 NOTE — Progress Notes (Signed)
[facility] Cardiology  SUBJECTIVE:  Wanda Stevens is a 38 year old female with a history of diabetes mellitus, and active smoking status, who presented to the ER yesterday with crushing chest pain, found to have a STEMI. Has received stent to RCA.   She feels well today. She no longer has any chest pain. She does report some back discomfort, which she attributes to sleeping in the wrong position.  Patient expresses concern about affording her brilinta medication given she has no insurance and due to her current financial situation. We will speak to case manager about getting one month supply of brilinta, and then consider switch to plavix.   She has no other concerns or issues at this time.   Vitals:   01/11/19 0000 01/11/19 0200 01/11/19 0300 01/11/19 0400  BP: 123/78 (!) 130/92 117/85 115/72  Pulse: 73 72 61 75  Resp: 17 (!) 21 (!) 23 20  Temp:      TempSrc:      SpO2:      Weight:      Height:        No intake or output data in the 24 hours ending 01/11/19 0804   PHYSICAL EXAM  General: Well developed, well nourished, in no acute distress HEENT:  Normocephalic and atramatic Neck:  No JVD.  Lungs: Clear bilaterally to auscultation and percussion. Heart: HRRR . Normal S1 and S2 without gallops or murmurs.  Extremities: No clubbing, cyanosis or edema.   Neuro: Alert and oriented X 3. Psych:  Good affect, responds appropriately   LABS: Basic Metabolic Panel: Recent Labs    01/10/19 1501 01/11/19 0419  NA 135 140  K 3.4* 3.7  CL 101 112*  CO2 20* 24  GLUCOSE 135* 114*  BUN 9 8  CREATININE 0.90 0.73  CALCIUM 9.3 8.4*   Liver Function Tests: No results for input(s): AST, ALT, ALKPHOS, BILITOT, PROT, ALBUMIN in the last 72 hours. No results for input(s): LIPASE, AMYLASE in the last 72 hours. CBC: Recent Labs    01/10/19 1501 01/11/19 0419  WBC 10.7* 7.6  NEUTROABS 5.4  --   HGB 13.8 12.4  HCT 41.7 37.7  MCV 89.1 90.4  PLT 297 220   Cardiac Enzymes: No results for  input(s): CKTOTAL, CKMB, CKMBINDEX, TROPONINI in the last 72 hours. BNP: Invalid input(s): POCBNP D-Dimer: No results for input(s): DDIMER in the last 72 hours. Hemoglobin A1C: Recent Labs    01/10/19 1727  HGBA1C 5.5   Fasting Lipid Panel: Recent Labs    01/11/19 0419  CHOL 232*  HDL 32*  LDLCALC 165*  TRIG 173*  CHOLHDL 7.3   Thyroid Function Tests: No results for input(s): TSH, T4TOTAL, T3FREE, THYROIDAB in the last 72 hours.  Invalid input(s): FREET3 Anemia Panel: No results for input(s): VITAMINB12, FOLATE, FERRITIN, TIBC, IRON, RETICCTPCT in the last 72 hours.  No results found.   Echo - pending  ASSESSMENT AND PLAN: Wanda Stevens is a 38 year old female with a history of recently diagnosed diabetes mellitus, and active smoking status, who presented to the ER yesterday with crushing chest pain, found to have a STEMI. On cardiac catheterization she was found to have 99% thrombotic stenosis to distal RCA. There was also 60% stenosis to mid LAD, and 30% stenosis to 1st Marg artery. She underwent PCI and stent placement with Resolute Onyx (2.5 x 15 mm) to RCA by Dr. Darrold Junker. She feels well today with resolution of her chest pain. She has begun optimum medical  management with dual antiplatelet therapy, ACE, and beta blocker.   Active Problems:   STEMI involving oth coronary artery of inferior wall ([place])    1. Follow up ECHO results 2. Continue dual antiplatelet therapy uninterrupted for 12 months. She will remain on aspirin and brilinta for first month, then likely switch brilinta to plavix due to financial burden of brilinta.  3. Continue statin, beta blocker, and ACE 4. Close outpatient follow up with [facility] cardiologist of her choice in ~1 week   Hilbert Odor, PA-C 01/11/2019 8:04 AM

## 2019-01-11 NOTE — Progress Notes (Signed)
CARE PLAN  Wanda Stevens is a 38 y.o. Female with a past medical history of hypertension, hyperlipidemia, and pre-diabetes who presented to [facility] ED on 01/10/19 with complaints of 10/10 substernal chest pain.  She was found to have a Inferior STEMI on EKG.  She was taken for emergent cardiac catheterization, which revealed 99% of the distal RCA, and 60% stenosis of the mid LAD.  Successful PCI with drug eluting stent to the distal RCA was performed.  She returns to ICU post procedure.  She is hemodynamically stable and in no acute distress.  PCCM is not officially consulted, however is available as needed for any critical care needs while she is in the ICU.   SYNOPSIS/PLAN  STEMI Hx: HTN -Status post PCI with DES to distal RCA -Cardiac monitoring -Continue Aspirin & Brilinta as per Cardiology -Continue statin -Echocardiogram pending        Wanda Stevens, AGACNP-BC [facility] Pager: [phone]

## 2019-01-11 NOTE — Progress Notes (Signed)
Received patient from previous Nurse Selena Batten. I agree with previous nurse's assessment and will be taking over patients care. Patient is stable with no complaints will continued to monitor.

## 2019-01-12 ENCOUNTER — Encounter: Payer: Self-pay | Admitting: Cardiology

## 2019-01-12 LAB — GLUCOSE, CAPILLARY
Glucose-Capillary: 110 mg/dL — ABNORMAL HIGH (ref 70–99)
Glucose-Capillary: 87 mg/dL (ref 70–99)

## 2019-01-12 MED ORDER — ASPIRIN EC 81 MG PO TBEC: 81.0000 mg | DELAYED_RELEASE_TABLET | Freq: Every day | ORAL | 2 refills | Status: AC

## 2019-01-12 MED ORDER — ACETAMINOPHEN 325 MG PO TABS: 650.0000 mg | ORAL_TABLET | ORAL | Status: AC | PRN

## 2019-01-12 MED ORDER — METOPROLOL SUCCINATE ER 25 MG PO TB24: 12.5000 mg | ORAL_TABLET | Freq: Every day | ORAL | 0 refills | Status: AC

## 2019-01-12 MED ORDER — NICOTINE 14 MG/24HR TD PT24: 14.0000 mg | MEDICATED_PATCH | Freq: Every day | TRANSDERMAL | 0 refills | Status: DC

## 2019-01-12 MED ORDER — ATORVASTATIN CALCIUM 80 MG PO TABS: 80.0000 mg | ORAL_TABLET | Freq: Every day | ORAL | 0 refills | Status: AC

## 2019-01-12 MED ORDER — TICAGRELOR 90 MG PO TABS: 90.0000 mg | ORAL_TABLET | Freq: Two times a day (BID) | ORAL | 0 refills | Status: DC

## 2019-01-12 MED ORDER — NITROGLYCERIN 0.4 MG SL SUBL: 0.4000 mg | SUBLINGUAL_TABLET | SUBLINGUAL | 0 refills | Status: AC | PRN

## 2019-01-12 MED ORDER — LISINOPRIL 2.5 MG PO TABS: 2.5000 mg | ORAL_TABLET | Freq: Every day | ORAL | 0 refills | Status: AC

## 2019-01-12 NOTE — Progress Notes (Signed)
Cardiovascular and Pulmonary Nurse Navigator Note:    38 year old female with hx of HLD, HTN, pre-diabetes  who presented to the ED with servere crushing chest pain.  In the ER patient was found to have STEMI in there inferior leads.  Patient was emergently taken to the Cardiac Cath Lab and stent was placed in the distal RCA.    Paraschos, Alexander, MD (Primary)    Procedures  Coronary/Graft Acute MI Revascularization  CORONARY STENT INTERVENTION  LEFT HEART CATH AND CORONARY ANGIOGRAPHY  Conclusion    Dist RCA lesion is 99% stenosed.  A drug-eluting stent was successfully placed using a STENT RESOLUTE ONYX 2.5X15.  Post intervention, there is a 0% residual stenosis.  1st Mrg lesion is 30% stenosed.  Mid LAD lesion is 60% stenosed.   1.  Inferior ST elevation myocardial infarction 2.  99% thrombotic stenosis distal RCA, 60% stenosis mid LAD 3.  Preserved left ventricular function with inferoapical wall hypokinesis with LVEF 45 to 50% 4.  Successful primary PCI, with DES distal RCA    Education:   Heart Attack Bouncing Back" booklet given and reviewed with patient. Discussed the definition of CAD. Reviewed the location of CAD and where her stent was placed. Informed patient she will be given a stent card. Explained the purpose of the stent card. Instructed patient to keep stent card in her wallet.   Discussed modifiable risk factors including controlling blood pressure, cholesterol, and blood sugar; following heart healthy diet; maintaining healthy weight; exercise; and smoking cessation.    ? Discussed cardiac medications including rationale for taking, mechanisms of action, and side effects. Stressed the importance of taking medications as prescribed. Patient informed this RN that if she has to make a choice between paying her light bill and paying for her medications, she will pay the light bill because she has small children at home.  This RN entered order for CM / SW TOC  consult to assist with medications.  Patient will also need Brilinta coupon for first 30 days free. Dr. Darrold Junker plans to switch patient to Plavix after one month on Brilinta.   ? Discussed emergency plan for heart attack symptoms. Patient verbalized understanding of need to call 911 and not to drive herself to ER if having cardiac symptoms / chest pain.   ? Diet of low sodium, low fat, low cholesterol heart healthy / carb modified diet discussed. Information on diet provided. ? Smoking Cessation - Patient is a CURRENT  every day smoker. Patient has been advised to quit by her cardiologist.  Patient reports smoking amount 1/2 to 3/4 pack per day.   "Thinking about Quitting - Yes You Can!" informational sheet reviewed with patient.     Exercise - Benefits of exercised discussed. 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  Patient works from 12:00 noon to 9:30 p.m.   Explained to patient her cardiologist has referred her to outpatient Cardiac Rehab at [facility].  Overview of program provided.  Patient does not have health insurance and cannot afford to pay out-of-pocket.   This RN then offered the Virtual Cardiac Rehab program to patient.  Patient gave verbal consent to participate in this Virtual Cardiac Rehab program.    Confirm Consent - "In the setting of the current Covid19 crisis, you are scheduled to join our "At Home" [facility]  Cardiac or Pulmonary  Rehab program . Just as we do with many in-gym visits, in order for you to  participate in this program, we must obtain consent.  If you'd like, I can send this to your mychart (if signed up) or email for you to review.  Otherwise, I can obtain your verbal consent now.  By agreeing to a Cardiac or Pulmonary Rehab Telehealth visit, we'd like you to understand that the technology does not allow for your Cardiac or Pulmonary Rehab team member to perform a physical assessment, and thus may limit their ability to fully  assess your ability to perform exercise programs. If your provider identifies any concerns that need to be evaluated in person, we will make arrangements to do so.  Finally, though the technology is pretty good, we cannot assure that it will always work on either your or our end and we cannot ensure that we have a secure connection.  Cardiac and Pulmonary Rehab Telehealth visits and "At Home" cardiac and pulmonary rehab are provided at no cost to you. Are you willing to proceed?"   STAFF: Did the patient verbally acknowledge consent to telehealth visit? YES ?  Patient appreciative of the information.  ? Roanna Epley, RN, BSN, [place]  [facility] Cardiac & Pulmonary Rehab  Cardiovascular & Pulmonary Nurse Navigator  Direct Line: [phone]  Department Phone #: [phone] Fax: [phone]  Email Address: [email or web address]

## 2019-01-12 NOTE — Discharge Summary (Addendum)
[facility] - [place] at [facility]   PATIENT NAME: Wanda Stevens    MR#:  [id number]  DATE OF BIRTH:  12/16/1980  DATE OF ADMISSION:  01/10/2019 ADMITTING PHYSICIAN: Marcina Millard, MD  DATE OF DISCHARGE:  01/12/19   PRIMARY CARE PHYSICIAN: [place], [facility]    ADMISSION DIAGNOSIS:  Acute ST elevation myocardial infarction (STEMI) involving other coronary artery of inferior wall (HCC) [I21.19] Chest pain, unspecified type [R07.9] STEMI involving oth coronary artery of inferior wall (HCC) [I21.19]  DISCHARGE DIAGNOSIS:  Active Problems:   STEMI involving oth coronary artery of inferior wall (HCC)   SECONDARY DIAGNOSIS:   Past Medical History:  Diagnosis Date  . Hyperlipidemia   . Hypertension   . Pre-diabetes     HOSPITAL COURSE:  HPI  Wanda Stevens  is a 38 y.o. female presents to the hospital with severe crushing chest pain.  It felt like 1000 elephant stepping on her.  Also a burning component.  It lasted until he gave her medications in the emergency room.  Nothing made it better besides those medications and rubbing a little bit on her chest.  It was associated with nausea, sweating, shortness of breath and right arm numbness.  In the ER she was found to have a STEMI in the inferior leads.  She was taken to the cardiac Cath Lab and a stent was placed in the RCA.  Hospitalist services contacted for admission  1. Acute STEMI inferior leads with chest pain and shortness of breath. Patient was code STEMI , taken Cath Lab by Dr. Evette Georges and a stent was placed in the RCA. Patient placed on aspirin, Brilinta and high intensity atorvastatin. Continue aspirin, Brilinta 90 mg twice a day for 1 month followed by switching over to Plavix 75 mg once daily, statin, beta-blocker and ACE inhibitor.  Social worker consult placed and coupons are provided for Brilinta 1 month supply and Lipitor.  Providing medication assistance for the rest of the  medications 2. Hypertension -on beta-blocker and ACE inhibitor titrate as needed 3. Hyperlipidemia-LDL 165 , started on high intensity statin. 4. Prediabetes -hemoglobin A1c-5.5 and place on sliding scale insulin during the hospital course.  Resume home medication Metformin and carb modified diet 5. Tobacco abuse.Smoking cessation counseling done by me. Nicotine patch   Okay to discharge patient from cardiology standpoint.  Discussed with Dr. Darrold Junker Outpatient follow-up with cardiac rehabilitation  DISCHARGE CONDITIONS:   Stable   CONSULTS OBTAINED:  Treatment Team:  Marcina Millard, MD   PROCEDURES cardiac cath and RCA stent placement  DRUG ALLERGIES:   Allergies  Allergen Reactions  . Other     Other reaction(s): HIVES  . Neomycin-Bacitracin Zn-Polymyx Rash    Other reaction(s): OTHER  . Orange Oil Rash    Other reaction(s): HIVES    DISCHARGE MEDICATIONS:   Allergies as of 01/12/2019      Reactions   Other    Other reaction(s): HIVES   Neomycin-bacitracin Zn-polymyx Rash   Other reaction(s): OTHER   Orange Oil Rash   Other reaction(s): HIVES      Medication List    TAKE these medications   acetaminophen 325 MG tablet Commonly known as: TYLENOL Take 2 tablets (650 mg total) by mouth every 4 (four) hours as needed for headache or mild pain.   aspirin EC 81 MG tablet Take 1 tablet (81 mg total) by mouth daily.   atorvastatin 80 MG tablet Commonly known as: LIPITOR Take 1 tablet (80 mg  total) by mouth daily at 6 PM.   lisinopril 2.5 MG tablet Commonly known as: ZESTRIL Take 1 tablet (2.5 mg total) by mouth daily. Start taking on: January 13, 2019   metoprolol succinate 25 MG 24 hr tablet Commonly known as: TOPROL-XL Take 0.5 tablets (12.5 mg total) by mouth at bedtime.   nicotine 14 mg/24hr patch Commonly known as: NICODERM CQ - dosed in mg/24 hours Place 1 patch (14 mg total) onto the skin daily. Start taking on: January 13, 2019    nitroGLYCERIN 0.4 MG SL tablet Commonly known as: NITROSTAT Place 1 tablet (0.4 mg total) under the tongue every 5 (five) minutes as needed for chest pain.   ticagrelor 90 MG Tabs tablet Commonly known as: BRILINTA Take 1 tablet (90 mg total) by mouth 2 (two) times daily.        DISCHARGE INSTRUCTIONS:   Follow-up with primary care physician in 3 days Follow-up with cardiology as scheduled or in a week Follow-up with cardiac rehabilitation in 2 to 3 days Stop smoking  DIET:  Cardiac diet and Diabetic diet  DISCHARGE CONDITION:  Fair  ACTIVITY:  Activity as tolerated  OXYGEN:  Home Oxygen: No.   Oxygen Delivery: room air  DISCHARGE LOCATION:  home   If you experience worsening of your admission symptoms, develop shortness of breath, life threatening emergency, suicidal or homicidal thoughts you must seek medical attention immediately by calling 911 or calling your MD immediately  if symptoms less severe.  You Must read complete instructions/literature along with all the possible adverse reactions/side effects for all the Medicines you take and that have been prescribed to you. Take any new Medicines after you have completely understood and accpet all the possible adverse reactions/side effects.   Please note  You were cared for by a hospitalist during your hospital stay. If you have any questions about your discharge medications or the care you received while you were in the hospital after you are discharged, you can call the unit and asked to speak with the hospitalist on call if the hospitalist that took care of you is not available. Once you are discharged, your primary care physician will handle any further medical issues. Please note that NO REFILLS for any discharge medications will be authorized once you are discharged, as it is imperative that you return to your primary care physician (or establish a relationship with a primary care physician if you do not have one)  for your aftercare needs so that they can reassess your need for medications and monitor your lab values.     Today  No chief complaint on file.  Patient is feeling fine.  Denies any chest pain or shortness of breath.  Okay to discharge patient from cardiology standpoint  ROS:  CONSTITUTIONAL: Denies fevers, chills. Denies any fatigue, weakness.  EYES: Denies blurry vision, double vision, eye pain. EARS, NOSE, THROAT: Denies tinnitus, ear pain, hearing loss. RESPIRATORY: Denies cough, wheeze, shortness of breath.  CARDIOVASCULAR: Denies chest pain, palpitations, edema.  GASTROINTESTINAL: Denies nausea, vomiting, diarrhea, abdominal pain. Denies bright red blood per rectum. GENITOURINARY: Denies dysuria, hematuria. ENDOCRINE: Denies nocturia or thyroid problems. HEMATOLOGIC AND LYMPHATIC: Denies easy bruising or bleeding. SKIN: Denies rash or lesion. MUSCULOSKELETAL: Denies pain in neck, back, shoulder, knees, hips or arthritic symptoms.  NEUROLOGIC: Denies paralysis, paresthesias.  PSYCHIATRIC: Denies anxiety or depressive symptoms.   VITAL SIGNS:  Blood pressure 98/67, pulse 66, temperature 98 F (36.7 C), temperature source Oral, resp. rate 18, height 5'  4" (1.626 m), weight 91.6 kg, SpO2 97 %.  I/O:  No intake or output data in the 24 hours ending 01/12/19 1218  PHYSICAL EXAMINATION:  GENERAL:  38 y.o.-year-old patient lying in the bed with no acute distress.  EYES: Pupils equal, round, reactive to light and accommodation. No scleral icterus. Extraocular muscles intact.  HEENT: Head atraumatic, normocephalic. Oropharynx and nasopharynx clear.  NECK:  Supple, no jugular venous distention. No thyroid enlargement, no tenderness.  LUNGS: Normal breath sounds bilaterally, no wheezing, rales,rhonchi or crepitation. No use of accessory muscles of respiration.  CARDIOVASCULAR: S1, S2 normal. No murmurs, rubs, or gallops.  ABDOMEN: Soft, non-tender, non-distended. Bowel sounds  present EXTREMITIES: No pedal edema, cyanosis, or clubbing.  NEUROLOGIC: Cranial nerves II through XII are intact. Muscle strength 5/5 in all extremities. Sensation intact. Gait not checked.  PSYCHIATRIC: The patient is alert and oriented x 3.  SKIN: No obvious rash, lesion, or ulcer.   DATA REVIEW:   CBC Recent Labs  Lab 01/11/19 0419  WBC 7.6  HGB 12.4  HCT 37.7  PLT 220    Chemistries  Recent Labs  Lab 01/11/19 0419  NA 140  K 3.7  CL 112*  CO2 24  GLUCOSE 114*  BUN 8  CREATININE 0.73  CALCIUM 8.4*    Cardiac Enzymes No results for input(s): TROPONINI in the last 168 hours.  Microbiology Results  Results for orders placed or performed during the hospital encounter of 01/10/19  SARS Coronavirus 2 by RT PCR (hospital order, performed in [facility] hospital lab) Nasopharyngeal Nasopharyngeal Swab     Status: None   Collection Time: 01/10/19  3:01 PM   Specimen: Nasopharyngeal Swab  Result Value Ref Range Status   SARS Coronavirus 2 NEGATIVE NEGATIVE Final    Comment: (NOTE) If result is NEGATIVE SARS-CoV-2 target nucleic acids are NOT DETECTED. The SARS-CoV-2 RNA is generally detectable in upper and lower  respiratory specimens during the acute phase of infection. The lowest  concentration of SARS-CoV-2 viral copies this assay can detect is 250  copies / mL. A negative result does not preclude SARS-CoV-2 infection  and should not be used as the sole basis for treatment or other  patient management decisions.  A negative result may occur with  improper specimen collection / handling, submission of specimen other  than nasopharyngeal swab, presence of viral mutation(s) within the  areas targeted by this assay, and inadequate number of viral copies  (<250 copies / mL). A negative result must be combined with clinical  observations, patient history, and epidemiological information. If result is POSITIVE SARS-CoV-2 target nucleic acids are DETECTED. The  SARS-CoV-2 RNA is generally detectable in upper and lower  respiratory specimens dur ing the acute phase of infection.  Positive  results are indicative of active infection with SARS-CoV-2.  Clinical  correlation with patient history and other diagnostic information is  necessary to determine patient infection status.  Positive results do  not rule out bacterial infection or co-infection with other viruses. If result is PRESUMPTIVE POSTIVE SARS-CoV-2 nucleic acids MAY BE PRESENT.   A presumptive positive result was obtained on the submitted specimen  and confirmed on repeat testing.  While 2019 novel coronavirus  (SARS-CoV-2) nucleic acids may be present in the submitted sample  additional confirmatory testing may be necessary for epidemiological  and / or clinical management purposes  to differentiate between  SARS-CoV-2 and other Sarbecovirus currently known to infect humans.  If clinically indicated additional testing with an  alternate test  methodology [phone] is advised. The SARS-CoV-2 RNA is generally  detectable in upper and lower respiratory sp ecimens during the acute  phase of infection. The expected result is Negative. Fact Sheet for Patients:  BoilerBrush.com.cyhttps://www.fda.gov/media/136312/download Fact Sheet for Healthcare Providers: https://pope.com/https://www.fda.gov/media/136313/download This test is not yet approved or cleared by the [place] FDA and has been authorized for detection and/or diagnosis of SARS-CoV-2 by FDA under an Emergency Use Authorization (EUA).  This EUA will remain in effect (meaning this test can be used) for the duration of the COVID-19 declaration under Section 564(b)(1) of the Act, 21 U.S.C. section 360bbb-3(b)(1), unless the authorization is terminated or revoked sooner. Performed at [facility], [address] [place], [place] [address]   MRSA PCR Screening     Status: None   Collection Time: 01/10/19  5:29 PM   Specimen: Nasal Mucosa;  Nasopharyngeal  Result Value Ref Range Status   MRSA by PCR NEGATIVE NEGATIVE Final    Comment:        The GeneXpert MRSA Assay (FDA approved for NASAL specimens only), is one component of a comprehensive MRSA colonization surveillance program. It is not intended to diagnose MRSA infection nor to guide or monitor treatment for MRSA infections. Performed at [facility], [address] [place], [place] [address]     RADIOLOGY:  No results found.  EKG:   Orders placed or performed during the hospital encounter of 01/10/19  . EKG 12-Lead immediately post procedure  . EKG 12-Lead  . EKG 12-Lead immediately post procedure  . EKG 12-Lead  . EKG 12-Lead  . EKG 12-Lead      Management plans discussed with the patient, she is  in agreement.  CODE STATUS:     Code Status Orders  (From admission, onward)         Start     Ordered   01/10/19 1656  Full code  Continuous     01/10/19 1655        Code Status History    This patient has a current code status but no historical code status.   Advance Care Planning Activity      TOTAL TIME TAKING CARE OF THIS PATIENT: 45  minutes.   Note: This dictation was prepared with Dragon dictation along with smaller phrase technology. Any transcriptional errors that result from this process are unintentional.   @[facility] @  on 01/12/2019 at 12:18 PM  Between 7am to 6pm - Pager - [phone]  After 6pm go to [email or web address] - password [email or web address] [facility]  [facility]  Office  [phone]  CC: Primary care physician; [place], [facility]

## 2019-01-12 NOTE — Discharge Instructions (Addendum)
Follow-up with primary care physician in 3 days Follow-up with cardiology as scheduled or in a week Follow-up with cardiac rehabilitation in 2 to 3 days Stop smoking   Acute Coronary Syndrome Acute coronary syndrome (ACS) is a serious problem in which there is suddenly not enough blood and oxygen reaching the heart. ACS can result in chest pain or a heart attack. This condition is a medical emergency. If you have any symptoms of this condition, get help right away. What are the causes? This condition may be caused by:  A buildup of fat and cholesterol inside the arteries (atherosclerosis). This is the most common cause. The buildup (plaque) can cause blood vessels in the heart (coronary arteries) to become narrow or blocked, which reduces blood flow to the heart. Plaque can also break off and lead to a clot, which can block an artery and cause a heart attack or stroke.  Sudden tightening of the muscles around the coronary arteries (coronary spasm).  Tearing of a coronary artery (spontaneous coronary artery dissection).  Very low blood pressure (hypotension).  An abnormal heartbeat (arrhythmia).  Other medical conditions that cause a decrease of oxygen to the heart, such as anemiaorrespiratory failure.  Using cocaine or methamphetamine. What increases the risk? The following factors may make you more likely to develop this condition:  Age. The risk for ACS increases as you get older.  History of chest pain, heart attack, peripheral artery disease, or stroke.  Having taken chemotherapy or immune-suppressing medicines.  Being female.  Family history of chest pain, heart disease, or stroke.  Smoking.  Not exercising enough.  Being overweight.  High cholesterol.  High blood pressure (hypertension).  Diabetes.  Excessive alcohol use. What are the signs or symptoms? Common symptoms of this condition include:  Chest pain. The pain may last a long time, or it may stop and  come back (recur). It may feel like: ? Crushing or squeezing. ? Tightness, pressure, fullness, or heaviness.  Arm, neck, jaw, or back pain.  Heartburn or indigestion.  Shortness of breath.  Nausea.  Sudden cold sweats.  Light-headedness.  Dizziness or passing out.  Tiredness (fatigue). Sometimes there are no symptoms. How is this diagnosed? This condition may be diagnosed based on:  Your medical history and symptoms.  Imaging tests, such as: ? An electrocardiogram (ECG). This measures the heart's electrical activity. ? X-rays. ? CT scan. ? A coronary angiogram. For this test, dye is injected into the heart arteries and then X-rays are taken. ? Myocardial perfusion imaging. This test shows how well blood flows through your heart muscle.  Blood tests. These may be repeated at certain time intervals.  Exercise stress testing.  Echocardiogram. This is a test that uses sound waves to produce detailed images of the heart. How is this treated? Treatment for this condition may include:  Oxygen therapy.  Medicines, such as: ? Antiplatelet medicines and blood-thinning medicines, such as aspirin. These help prevent blood clots. ? Medicine that dissolves any blood clots (fibrinolytic therapy). ? Blood pressure medicines. ? Nitroglycerin. This helps widen blood vessels to improve blood flow. ? Pain medicine. ? Cholesterol-lowering medicine.  Surgery, such as: ? Coronary angioplasty with stent placement. This involves placing a small piece of metal that looks like mesh or a spring into a narrow coronary artery. This widens the artery and keeps it open. ? Coronary artery bypass surgery. This involves taking a section of a blood vessel from a different part of your body and placing it on  the blocked coronary artery to allow blood to flow around the blockage.  Cardiac rehabilitation. This is a program that includes exercise training, education, and counseling to help you  recover. Follow these instructions at home: Eating and drinking  Eat a heart-healthy diet that includes whole grains, fruits and vegetables, lean proteins, and low-fat or nonfat dairy products.  Limit how much salt (sodium) you eat as told by your health care provider. Follow instructions from your health care provider about any other eating or drinking restrictions, such as limiting foods that are high in fat and processed sugars.  Use healthy cooking methods such as roasting, grilling, broiling, baking, poaching, steaming, or stir-frying.  Work with a dietitian to follow a heart-healthy eating plan. Medicines  Take over-the-counter and prescription medicines only as told by your health care provider.  Do not take these medicines unless your health care provider approves: ? Vitamin supplements that contain vitamin A or vitamin E. ? NSAIDs, such as ibuprofen, naproxen, or celecoxib. ? Hormone replacement therapy that contains estrogen.  If you are taking blood thinners: ? Talk with your health care provider before you take any medicines that contain aspirin or NSAIDs. These medicines increase your risk for dangerous bleeding. ? Take your medicine exactly as told, at the same time every day. ? Avoid activities that could cause injury or bruising, and follow instructions about how to prevent falls. ? Wear a medical alert bracelet, and carry a card that lists what medicines you take. Activity  Follow your cardiac rehabilitation program. Do exercises as told by your physical therapist.  Ask your health care provider what activities and exercises are safe for you. Follow his or her instructions about lifting, driving, or climbing stairs. Lifestyle  Do not use any products that contain nicotine or tobacco, such as cigarettes, e-cigarettes, and chewing tobacco. If you need help quitting, ask your health care provider.  Do not drink alcohol if your health care provider tells you not to  drink.  If you drink alcohol: ? Limit how much you have to 0-1 drink a day. ? Be aware of how much alcohol is in your drink. In the [place] one drink equals one 12 oz bottle of beer (355 mL), one 5 oz glass of wine (148 mL), or one 1 oz glass of hard liquor (44 mL).  Maintain a healthy weight. If you need to lose weight, work with your health care provider to do so safely. General instructions  Tell all the health care providers who provide care for you about your heart condition, including your dentist. This may affect the medicines or treatment you receive.  Manage any other health conditions you have, such as hypertension or diabetes. These conditions affect your heart.  Pay attention to your mental health. You may be at higher risk for depression. ? Find ways to manage stress. ? Talk to your health care provider about depression screening and treatment.  Keep your vaccinations up to date. ? Get the flu shot (influenza vaccine) every year. ? Get the pneumococcal vaccine if you are age 36 or older.  If directed, monitor your blood pressure at home.  Keep all follow-up visits as told by your health care provider. This is important. Contact a health care provider if you:  Feel overwhelmed or sad.  Have trouble doing your daily activities. Get help right away if you:  Have pain in your chest, neck, arm, jaw, stomach, or back that recurs, and: ? It lasts for more than  a few minutes. ? It is not relieved by taking the medicineyour health care provider prescribed.  Have unexplained: ? Heavy sweating. ? Heartburn or indigestion. ? Nausea or vomiting. ? Shortness of breath. ? Difficulty breathing. ? Fatigue. ? Nervousness or anxiety. ? Weakness. ? Diarrhea. ? Dark stools or blood in your stool.  Have sudden light-headedness or dizziness.  Have blood pressure that is higher than 180/120.  Faint.  Have thoughts about hurting yourself. These symptoms may represent a  serious problem that is an emergency. Do not wait to see if the symptoms will go away. Get medical help right away. Call your local emergency services (911 in the [place] Do not drive yourself to the hospital.  Summary  Acute coronary syndrome (ACS) is when there is not enough blood and oxygen being supplied to the heart. ACS can result in chest pain or a heart attack.  Acute coronary syndrome is a medical emergency. If you have any symptoms of this condition, get help right away.  Treatment includes medicines and procedures to open the blocked arteries and restore blood flow. This information is not intended to replace advice given to you by your health care provider. Make sure you discuss any questions you have with your health care provider. Document Released: 03/01/2005 Document Revised: 03/13/2018 Document Reviewed: 03/13/2018 Elsevier Patient Education  2020 [employer]

## 2019-01-12 NOTE — TOC Transition Note (Signed)
Transition of Care [facility]) - CM/SW Discharge Note   Patient Details  Name: Wanda Stevens MRN: [id number] Date of Birth: [birth year]  [facility] CM/SW Contact:  Ross Ludwig, LCSW Phone Number: 01/12/2019, 1:04 PM   Clinical Narrative:     CSW received consult that patient needed assistance with medications due to not having any insurance.  CSW provided patient coupon for 30 day supply of Brilenta, and good rx coupons for couple of her other meds.  Patient also was given an application for medication management clinic.  Patient states she goes to [facility] for her PCP appointments.  Patient expressed she did not have any other issues or concerns.   Final next level of care: Home/Self Care Barriers to Discharge: No Barriers Identified   Patient Goals and CMS Choice Patient states their goals for this hospitalization and ongoing recovery are:: To return back home with coupons for Brilenta and other medications.   Choice offered to / list presented to : NA  Discharge Placement  Patient will be discharging back home, she was given coupons for her medications.  Patient stated that open enrollment is starting so she can apply for insurance through her work.  Patient did not have any other questions or concerns.               Discharge Plan and Services                DME Arranged: N/A DME Agency: NA     Representative spoke with at DME Agency: na HH Arranged: NA          Social Determinants of Health (SDOH) Interventions     Readmission Risk Interventions No flowsheet data found.

## 2019-01-17 ENCOUNTER — Other Ambulatory Visit: Payer: Self-pay

## 2019-01-17 ENCOUNTER — Encounter: Payer: BLUE CROSS/BLUE SHIELD | Attending: Cardiology | Admitting: *Deleted

## 2019-01-17 DIAGNOSIS — Z955 Presence of coronary angioplasty implant and graft: Secondary | ICD-10-CM

## 2019-01-17 DIAGNOSIS — I2111 ST elevation (STEMI) myocardial infarction involving right coronary artery: Secondary | ICD-10-CM

## 2019-01-17 NOTE — Progress Notes (Signed)
Confirm Consent - "In the setting of the current Covid19 crisis, you are scheduled to join our "At Home" [facility]  Cardiac or Pulmonary  Rehab program . Just as we do with many in-gym visits, in order for you to participate in this program, we must obtain consent.  If you'd like, I can send this to your mychart (if signed up) or email for you to review.  Otherwise, I can obtain your verbal consent now.  By agreeing to a Cardiac or Pulmonary Rehab Telehealth visit, we'd like you to understand that the technology does not allow for your Cardiac or Pulmonary Rehab team member to perform a physical assessment, and thus may limit their ability to fully assess your ability to perform exercise programs. If your provider identifies any concerns that need to be evaluated in person, we will make arrangements to do so.  Finally, though the technology is pretty good, we cannot assure that it will always work on either your or our end and we cannot ensure that we have a secure connection.  Cardiac and Pulmonary Rehab Telehealth visits and "At Home" cardiac and pulmonary rehab are provided at no cost to you. Are you willing to proceed?" STAFF: Did the patient verbally acknowledge consent to telehealth visit? Document YES/NO here: YES     Date and Time                                                Staff completing consent process: Alberteen Sam, [place], [place] 01/17/2019 10:04 AM   Email: [email or web address] Phone: [phone] Diagnosis: STEMI stent RCA CONSENT COMPLETED: Yes  Risk Stratification:moderate Risk Factors:  HLD, Tobacco (down to 1/2 ppk),  Current Exercise: walk at work Patient Exercise Barriers: none Mobility Assistive Device at Home:  none  Vital Sign Devices at Home: none (will talk to foundation) Exercise Equipment at Home:  none Followup appointment made: Yes To use [facility]:Yes             Entered on Dashboard Yes             SMS sent with invite Yes   Pt is set up for Home Exercise review on  11/11.

## 2019-01-24 ENCOUNTER — Other Ambulatory Visit: Payer: Self-pay

## 2019-01-24 ENCOUNTER — Encounter: Payer: BLUE CROSS/BLUE SHIELD | Admitting: *Deleted

## 2019-01-24 DIAGNOSIS — Z955 Presence of coronary angioplasty implant and graft: Secondary | ICD-10-CM

## 2019-01-24 DIAGNOSIS — I2111 ST elevation (STEMI) myocardial infarction involving right coronary artery: Secondary | ICD-10-CM

## 2019-01-24 NOTE — Progress Notes (Signed)
Starting out your exercise session should last for 15 to 30 minutes for 3-5 days a week.    Your progression for home exercise is:  Start at 15 min 3 days a week consistently. Then begin to add a minute each day until you can do 30 min 3 days a week.  Continue at this level for 1-2 weeks then begin to add in your 4th and 5th days.  Another alternative will be to start with one round of videos (moderate intensity) and then add in the second round to reach your 30 min.  You can also combine the videos with your walking too.   Resistance Training: Start with 6-8 exercises for 12 reps each or follow along with a video. Exercises can be found in packet that will be mailed to you.   Exercise at a comfortable pace, using your heart rate and rate of perceived exertion as guides for intensity.  Your target heart rate range is 113-160.

## 2019-01-24 NOTE — Progress Notes (Signed)
Completed initial RD evaluation 

## 2019-01-24 NOTE — Progress Notes (Signed)
Cardiac Individual Treatment Plan  Patient Details  Name: Wanda Stevens MRN: [id number] Date of Birth: May 18, 1980 Referring Provider:     Cardiac Rehab from 01/24/2019 in [facility] Cardiac and Pulmonary Rehab  Referring Provider  Isaias Cowman MD      Initial Encounter Date:    Cardiac Rehab from 01/24/2019 in [facility] Cardiac and Pulmonary Rehab  Date  01/24/19      Visit Diagnosis: ST elevation myocardial infarction involving right coronary artery [facility])  Status post coronary artery stent placement  Patient's Home Medications on Admission:  Current Outpatient Medications:  .  acetaminophen (TYLENOL) 325 MG tablet, Take 2 tablets (650 mg total) by mouth every 4 (four) hours as needed for headache or mild pain., Disp:  , Rfl:  .  aspirin EC 81 MG tablet, Take 1 tablet (81 mg total) by mouth daily., Disp: 150 tablet, Rfl: 2 .  atorvastatin (LIPITOR) 80 MG tablet, Take 1 tablet (80 mg total) by mouth daily at 6 PM., Disp: 30 tablet, Rfl: 0 .  lisinopril (ZESTRIL) 2.5 MG tablet, Take 1 tablet (2.5 mg total) by mouth daily., Disp: 30 tablet, Rfl: 0 .  metoprolol succinate (TOPROL-XL) 25 MG 24 hr tablet, Take 0.5 tablets (12.5 mg total) by mouth at bedtime., Disp: 30 tablet, Rfl: 0 .  nicotine (NICODERM CQ - DOSED IN MG/24 HOURS) 14 mg/24hr patch, Place 1 patch (14 mg total) onto the skin daily., Disp: 28 patch, Rfl: 0 .  nitroGLYCERIN (NITROSTAT) 0.4 MG SL tablet, Place 1 tablet (0.4 mg total) under the tongue every 5 (five) minutes as needed for chest pain., Disp: 10 tablet, Rfl: 0 .  ticagrelor (BRILINTA) 90 MG TABS tablet, Take 1 tablet (90 mg total) by mouth 2 (two) times daily., Disp: 60 tablet, Rfl: 0  Past Medical History: Past Medical History:  Diagnosis Date  . Hyperlipidemia   . Hypertension   . Pre-diabetes     Tobacco Use: Social History   Tobacco Use  Smoking Status Current Every Day Smoker  . Packs/day: 0.50  Smokeless Tobacco Never Used    Labs: Recent  Review Flowsheet Data    Labs for ITP Cardiac and Pulmonary Rehab Latest Ref Rng & Units 01/10/2019 01/11/2019   Cholestrol 0 - 200 mg/dL - 232(H)   LDLCALC 0 - 99 mg/dL - 165(H)   HDL >40 mg/dL - 32(L)   Trlycerides <150 mg/dL - 173(H)   Hemoglobin A1c 4.8 - 5.6 % 5.5 -       Exercise Target Goals: Exercise Program Goal: Individual exercise prescription set using results from initial 6 min walk test and THRR while considering  patient's activity barriers and safety.   Exercise Prescription Goal: Initial exercise prescription builds to 30-45 minutes a day of aerobic activity, 2-3 days per week.  Home exercise guidelines will be given to patient during program as part of exercise prescription that the participant will acknowledge.  Activity Barriers & Risk Stratification: Activity Barriers & Cardiac Risk Stratification - 01/24/19 1028      Activity Barriers & Cardiac Risk Stratification   Activity Barriers  None    Cardiac Risk Stratification  Moderate       6 Minute Walk:   Oxygen Initial Assessment:   Oxygen Re-Evaluation:   Oxygen Discharge (Final Oxygen Re-Evaluation):   Initial Exercise Prescription: Initial Exercise Prescription - 01/24/19 1000      Date of Initial Exercise RX and Referring Provider   Date  01/24/19    Referring Provider  Paraschos,  Alexander MD      Track   Minutes  15   walking and staff videos     Prescription Details   Frequency (times per week)  3    Duration  Progress to 30 minutes of continuous aerobic without signs/symptoms of physical distress      Intensity   THRR 40-80% of Max Heartrate  113-160    Ratings of Perceived Exertion  11-13    Perceived Dyspnea  0-4      Progression   Progression  Continue to progress workloads to maintain intensity without signs/symptoms of physical distress.      Resistance Training   Training Prescription  Yes    Weight  Body Weight/ROM    Reps  10-15       Perform Capillary Blood  Glucose checks as needed.  Exercise Prescription Changes:   Exercise Comments:   Exercise Goals and Review: Exercise Goals    Row Name 01/24/19 1029             Exercise Goals   Increase Physical Activity  Yes       Intervention  Provide advice, education, support and counseling about physical activity/exercise needs.;Develop an individualized exercise prescription for aerobic and resistive training based on initial evaluation findings, risk stratification, comorbidities and participant's personal goals.       Expected Outcomes  Short Term: Attend rehab on a regular basis to increase amount of physical activity.;Long Term: Add in home exercise to make exercise part of routine and to increase amount of physical activity.;Long Term: Exercising regularly at least 3-5 days a week.       Increase Strength and Stamina  Yes       Intervention  Provide advice, education, support and counseling about physical activity/exercise needs.;Develop an individualized exercise prescription for aerobic and resistive training based on initial evaluation findings, risk stratification, comorbidities and participant's personal goals.       Expected Outcomes  Short Term: Perform resistance training exercises routinely during rehab and add in resistance training at home;Short Term: Increase workloads from initial exercise prescription for resistance, speed, and METs.;Long Term: Improve cardiorespiratory fitness, muscular endurance and strength as measured by increased METs and functional capacity (6MWT)       Able to understand and use rate of perceived exertion (RPE) scale  Yes       Intervention  Provide education and explanation on how to use RPE scale       Expected Outcomes  Short Term: Able to use RPE daily in rehab to express subjective intensity level;Long Term:  Able to use RPE to guide intensity level when exercising independently       Able to understand and use Dyspnea scale  Yes       Intervention   Provide education and explanation on how to use Dyspnea scale       Expected Outcomes  Short Term: Able to use Dyspnea scale daily in rehab to express subjective sense of shortness of breath during exertion;Long Term: Able to use Dyspnea scale to guide intensity level when exercising independently       Knowledge and understanding of Target Heart Rate Range (THRR)  Yes       Intervention  Provide education and explanation of THRR including how the numbers were predicted and where they are located for reference       Expected Outcomes  Short Term: Able to state/look up THRR;Short Term: Able to use daily as guideline for intensity in  rehab;Long Term: Able to use THRR to govern intensity when exercising independently       Able to check pulse independently  Yes       Intervention  Provide education and demonstration on how to check pulse in carotid and radial arteries.;Review the importance of being able to check your own pulse for safety during independent exercise       Expected Outcomes  Short Term: Able to explain why pulse checking is important during independent exercise;Long Term: Able to check pulse independently and accurately       Understanding of Exercise Prescription  Yes       Intervention  Provide education, explanation, and written materials on patient's individual exercise prescription       Expected Outcomes  Short Term: Able to explain program exercise prescription;Long Term: Able to explain home exercise prescription to exercise independently          Exercise Goals Re-Evaluation :   Discharge Exercise Prescription (Final Exercise Prescription Changes):   Nutrition:  Target Goals: Understanding of nutrition guidelines, daily intake of sodium <154m, cholesterol <2029m calories 30% from fat and 7% or less from saturated fats, daily to have 5 or more servings of fruits and vegetables.  Biometrics:    Nutrition Therapy Plan and Nutrition Goals:   Nutrition  Assessments:   Nutrition Goals Re-Evaluation:   Nutrition Goals Discharge (Final Nutrition Goals Re-Evaluation):   Psychosocial: Target Goals: Acknowledge presence or absence of significant depression and/or stress, maximize coping skills, provide positive support system. Participant is able to verbalize types and ability to use techniques and skills needed for reducing stress and depression.   Initial Review & Psychosocial Screening:   Quality of Life Scores:   Scores of 19 and below usually indicate a poorer quality of life in these areas.  A difference of  2-3 points is a clinically meaningful difference.  A difference of 2-3 points in the total score of the Quality of Life Index has been associated with significant improvement in overall quality of life, self-image, physical symptoms, and general health in studies assessing change in quality of life.  PHQ-9: Recent Review Flowsheet Data    There is no flowsheet data to display.     Interpretation of Total Score  Total Score Depression Severity:  1-4 = Minimal depression, 5-9 = Mild depression, 10-14 = Moderate depression, 15-19 = Moderately severe depression, 20-27 = Severe depression   Psychosocial Evaluation and Intervention:   Psychosocial Re-Evaluation:   Psychosocial Discharge (Final Psychosocial Re-Evaluation):   Vocational Rehabilitation: Provide vocational rehab assistance to qualifying candidates.   Vocational Rehab Evaluation & Intervention:   Education: Education Goals: Education classes will be provided on a variety of topics geared toward better understanding of heart health and risk factor modification. Participant will state understanding/return demonstration of topics presented as noted by education test scores.  Learning Barriers/Preferences:   Education Topics:  AED/CPR: - Group verbal and written instruction with the use of models to demonstrate the basic use of the AED with the basic ABC's  of resuscitation.   General Nutrition Guidelines/Fats and Fiber: -Group instruction provided by verbal, written material, models and posters to present the general guidelines for heart healthy nutrition. Gives an explanation and review of dietary fats and fiber.   Controlling Sodium/Reading Food Labels: -Group verbal and written material supporting the discussion of sodium use in heart healthy nutrition. Review and explanation with models, verbal and written materials for utilization of the food label.   Exercise  Physiology & General Exercise Guidelines: - Group verbal and written instruction with models to review the exercise physiology of the cardiovascular system and associated critical values. Provides general exercise guidelines with specific guidelines to those with heart or lung disease.    Aerobic Exercise & Resistance Training: - Gives group verbal and written instruction on the various components of exercise. Focuses on aerobic and resistive training programs and the benefits of this training and how to safely progress through these programs..   Flexibility, Balance, Mind/Body Relaxation: Provides group verbal/written instruction on the benefits of flexibility and balance training, including mind/body exercise modes such as yoga, pilates and tai chi.  Demonstration and skill practice provided.   Stress and Anxiety: - Provides group verbal and written instruction about the health risks of elevated stress and causes of high stress.  Discuss the correlation between heart/lung disease and anxiety and treatment options. Review healthy ways to manage with stress and anxiety.   Depression: - Provides group verbal and written instruction on the correlation between heart/lung disease and depressed mood, treatment options, and the stigmas associated with seeking treatment.   Anatomy & Physiology of the Heart: - Group verbal and written instruction and models provide basic cardiac  anatomy and physiology, with the coronary electrical and arterial systems. Review of Valvular disease and Heart Failure   Cardiac Procedures: - Group verbal and written instruction to review commonly prescribed medications for heart disease. Reviews the medication, class of the drug, and side effects. Includes the steps to properly store meds and maintain the prescription regimen. (beta blockers and nitrates)   Cardiac Medications I: - Group verbal and written instruction to review commonly prescribed medications for heart disease. Reviews the medication, class of the drug, and side effects. Includes the steps to properly store meds and maintain the prescription regimen.   Cardiac Medications II: -Group verbal and written instruction to review commonly prescribed medications for heart disease. Reviews the medication, class of the drug, and side effects. (all other drug classes)    Go Sex-Intimacy & Heart Disease, Get SMART - Goal Setting: - Group verbal and written instruction through game format to discuss heart disease and the return to sexual intimacy. Provides group verbal and written material to discuss and apply goal setting through the application of the S.M.A.R.T. Method.   Other Matters of the Heart: - Provides group verbal, written materials and models to describe Stable Angina and Peripheral Artery. Includes description of the disease process and treatment options available to the cardiac patient.   Exercise & Equipment Safety: - Individual verbal instruction and demonstration of equipment use and safety with use of the equipment.   Infection Prevention: - Provides verbal and written material to individual with discussion of infection control including proper hand washing and proper equipment cleaning during exercise session.   Falls Prevention: - Provides verbal and written material to individual with discussion of falls prevention and safety.   Diabetes: - Individual  verbal and written instruction to review signs/symptoms of diabetes, desired ranges of glucose level fasting, after meals and with exercise. Acknowledge that pre and post exercise glucose checks will be done for 3 sessions at entry of program.   Know Your Numbers and Risk Factors: -Group verbal and written instruction about important numbers in your health.  Discussion of what are risk factors and how they play a role in the disease process.  Review of Cholesterol, Blood Pressure, Diabetes, and BMI and the role they play in your overall health.  Sleep Hygiene: -Provides group verbal and written instruction about how sleep can affect your health.  Define sleep hygiene, discuss sleep cycles and impact of sleep habits. Review good sleep hygiene tips.    Other: -Provides group and verbal instruction on various topics (see comments)   Knowledge Questionnaire Score:   Core Components/Risk Factors/Patient Goals at Admission: Personal Goals and Risk Factors at Admission - 01/24/19 1029      Core Components/Risk Factors/Patient Goals on Admission    Weight Management  Yes;Weight Loss    Intervention  Weight Management: Develop a combined nutrition and exercise program designed to reach desired caloric intake, while maintaining appropriate intake of nutrient and fiber, sodium and fats, and appropriate energy expenditure required for the weight goal.;Weight Management: Provide education and appropriate resources to help participant work on and attain dietary goals.    Expected Outcomes  Short Term: Continue to assess and modify interventions until short term weight is achieved;Long Term: Adherence to nutrition and physical activity/exercise program aimed toward attainment of established weight goal;Weight Loss: Understanding of general recommendations for a balanced deficit meal plan, which promotes 1-2 lb weight loss per week and includes a negative energy balance of [phone] kcal/d;Understanding  recommendations for meals to include 15-35% energy as protein, 25-35% energy from fat, 35-60% energy from carbohydrates, less than 279m of dietary cholesterol, 20-35 gm of total fiber daily;Understanding of distribution of calorie intake throughout the day with the consumption of 4-5 meals/snacks    Tobacco Cessation  Yes    Intervention  Assist the participant in steps to quit. Provide individualized education and counseling about committing to Tobacco Cessation, relapse prevention, and pharmacological support that can be provided by physician.;OAdvice worker assist with locating and accessing local/national Quit Smoking programs, and support quit date choice.    Expected Outcomes  Short Term: Will demonstrate readiness to quit, by selecting a quit date.;Short Term: Will quit all tobacco product use, adhering to prevention of relapse plan.;Long Term: Complete abstinence from all tobacco products for at least 12 months from quit date.    Lipids  Yes    Intervention  Provide education and support for participant on nutrition & aerobic/resistive exercise along with prescribed medications to achieve LDL <766m HDL >4042m   Expected Outcomes  Short Term: Participant states understanding of desired cholesterol values and is compliant with medications prescribed. Participant is following exercise prescription and nutrition guidelines.;Long Term: Cholesterol controlled with medications as prescribed, with individualized exercise RX and with personalized nutrition plan. Value goals: LDL < 53m34mDL > 40 mg.       Core Components/Risk Factors/Patient Goals Review:  Goals and Risk Factor Review    Row Name 01/24/19 1030             Core Components/Risk Factors/Patient Goals Review   Personal Goals Review  Tobacco Cessation       Review  Talked about quitting smoking with pt.  She is interested in quitting smoking.  Will send [employer] fake cigarette.  We will talk about setting  a quit date once she gets back to work.       Expected Outcomes  Short: Set quit date. Long: Complete cessation.          Core Components/Risk Factors/Patient Goals at Discharge (Final Review):  Goals and Risk Factor Review - 01/24/19 1030      Core Components/Risk Factors/Patient Goals Review   Personal Goals Review  Tobacco Cessation    Review  Talked about  quitting smoking with pt.  She is interested in quitting smoking.  Will send [employer] and fake cigarette.  We will talk about setting a quit date once she gets back to work.    Expected Outcomes  Short: Set quit date. Long: Complete cessation.       ITP Comments: ITP Comments    Row Name 01/17/19 1025 01/24/19 1027         ITP Comments  Completed virtual intake call.  Consent for virtual rehab recieved.  Next Appt scheduled for next Wed 11/11 for home exercise review.  Pt set up for [facility].  Completed initial ExRx created and sent to Dr. Emily Filbert, Medical Director to review and sign.         Comments: Initial Virtual ExRx

## 2019-01-31 ENCOUNTER — Encounter: Payer: BLUE CROSS/BLUE SHIELD | Admitting: *Deleted

## 2019-01-31 ENCOUNTER — Other Ambulatory Visit: Payer: Self-pay

## 2019-01-31 DIAGNOSIS — I2111 ST elevation (STEMI) myocardial infarction involving right coronary artery: Secondary | ICD-10-CM

## 2019-01-31 DIAGNOSIS — Z955 Presence of coronary angioplasty implant and graft: Secondary | ICD-10-CM

## 2019-01-31 NOTE — Progress Notes (Signed)
Completed virtual appointment today.   Wanda Stevens is doing great with her home based rehab.  She is walking and even power walking while at work!  She is struggling with counting her pulse before and after her workout.  I encouraged her to continue to try to count her pulse and to use RPE as well to garner her intensity level.  She has not gotten her home exercise packet yet but will let me know if it doesn't come by the end of the week.   She is really working hard on quitting smoking. She has stopped buying cigarettes and has to bum one when she needs it.  This week alone she has gone from 7, to 6, to 4 cigarettes yesterday.  Today, she did not have one when she first got up.  She is finding the habit of it is harder to quit.  She did get a few vapes to try for just the habit as she weans.  She is in the right place mentally and ready to quit.    Her weight has been going down.  She is doing well with her diet. She still needs a scale and blood pressure cuff for monitoring.  We will check with the foundation again about getting that stuff out to her to use quickly.  She tries to use the app, but her grandchild keeps trying to take her phone when she has it out.  We talked about backlogging when she gets the chance.    She enjoyed talking with Melissa and setting goals.  She has already started to apply some of the tips.  She has cut back on her [facility] some. Yesterday she only had 2 20 oz and a can and has not bought any more. She is trying to drink more water and find healthier options.  She even has a case of water she keeps in her car for work.  She is also now using smaller plates and eating small portions versus not eating at all.    She really is making great strides!!

## 2019-02-14 ENCOUNTER — Other Ambulatory Visit: Payer: Self-pay

## 2019-02-14 ENCOUNTER — Encounter: Payer: BLUE CROSS/BLUE SHIELD | Attending: Cardiology | Admitting: *Deleted

## 2019-02-14 DIAGNOSIS — Z955 Presence of coronary angioplasty implant and graft: Secondary | ICD-10-CM

## 2019-02-14 DIAGNOSIS — I2111 ST elevation (STEMI) myocardial infarction involving right coronary artery: Secondary | ICD-10-CM

## 2019-02-14 NOTE — Progress Notes (Signed)
Virtual follow up visit completed today.   Navi is doing well.  She got her scale and BP cuff in the mail from [employer].  She was so excited.  She was even more excited to see how much weight she is losing.  She is down 16 lb over the last month!!  She is still working on smoking cessation.  She is still at 3-4 a day that she bums from others as she is not buying any.  She never got the fake cigarette from [place], so I will resend it out.    Over all she is doing well mentally.  Her major stressor continues to be finances and lack of medical insurance. She says that she doesn't have any complaints.  She is doing well with her diet and tries not to eat too much late at night.  She admits to not eating the best last week with her granddaughter's birthday and Thanksgiving, but is getting back to it.   She has been tracking her steps and usually averages over 10,000 each day.  She got 20,000 on [facility] Friday as she worked two jobs that day.  Her family just received an elliptical from her husband's job so we will start using that at home too.   Next follow up scheduled for 02/28/19 and 03/21/19.

## 2019-02-22 ENCOUNTER — Emergency Department: Payer: BLUE CROSS/BLUE SHIELD

## 2019-02-22 ENCOUNTER — Other Ambulatory Visit: Payer: Self-pay

## 2019-02-22 ENCOUNTER — Emergency Department
Admission: EM | Admit: 2019-02-22 | Discharge: 2019-02-22 | Disposition: A | Discharge status: Home / Self Care | Payer: BLUE CROSS/BLUE SHIELD | Attending: Emergency Medicine | Admitting: Emergency Medicine

## 2019-02-22 DIAGNOSIS — Z7982 Long term (current) use of aspirin: Secondary | ICD-10-CM | POA: Insufficient documentation

## 2019-02-22 DIAGNOSIS — R0789 Other chest pain: Secondary | ICD-10-CM | POA: Insufficient documentation

## 2019-02-22 DIAGNOSIS — F17210 Nicotine dependence, cigarettes, uncomplicated: Secondary | ICD-10-CM | POA: Insufficient documentation

## 2019-02-22 DIAGNOSIS — I10 Essential (primary) hypertension: Secondary | ICD-10-CM | POA: Insufficient documentation

## 2019-02-22 DIAGNOSIS — Z79899 Other long term (current) drug therapy: Secondary | ICD-10-CM | POA: Insufficient documentation

## 2019-02-22 DIAGNOSIS — I2510 Atherosclerotic heart disease of native coronary artery without angina pectoris: Secondary | ICD-10-CM | POA: Insufficient documentation

## 2019-02-22 HISTORY — DX: Acute myocardial infarction, unspecified: I21.9

## 2019-02-22 LAB — BASIC METABOLIC PANEL
Anion gap: 7 (ref 5–15)
BUN: 11 mg/dL (ref 6–20)
CO2: 24 mmol/L (ref 22–32)
Calcium: 8.9 mg/dL (ref 8.9–10.3)
Chloride: 107 mmol/L (ref 98–111)
Creatinine, Ser: 0.78 mg/dL (ref 0.44–1.00)
GFR calc Af Amer: >60 mL/min (ref >60)
GFR calc non Af Amer: >60 mL/min (ref >60)
Glucose, Bld: 111 mg/dL — ABNORMAL HIGH (ref 70–99)
Potassium: 3.5 mmol/L (ref 3.5–5.1)
Sodium: 138 mmol/L (ref 135–145)

## 2019-02-22 LAB — CBC
HCT: 35.5 % — ABNORMAL LOW (ref 36.0–46.0)
Hemoglobin: 11.5 g/dL — ABNORMAL LOW (ref 12.0–15.0)
MCH: 29.8 pg (ref 26.0–34.0)
MCHC: 32.4 g/dL (ref 30.0–36.0)
MCV: 92 fL (ref 80.0–100.0)
Platelets: 224 10*3/uL (ref 150–400)
RBC: 3.86 MIL/uL — ABNORMAL LOW (ref 3.87–5.11)
RDW: 15.3 % (ref 11.5–15.5)
WBC: 8.9 10*3/uL (ref 4.0–10.5)
nRBC: 0.2 % (ref 0.0–0.2)

## 2019-02-22 LAB — POCT PREGNANCY, URINE: Preg Test, Ur: NEGATIVE

## 2019-02-22 LAB — TROPONIN I (HIGH SENSITIVITY)
Troponin I (High Sensitivity): 5 ng/L (ref <18)
Troponin I (High Sensitivity): 4 ng/L (ref <18)

## 2019-02-22 MED ORDER — ALUM & MAG HYDROXIDE-SIMETH 200-200-20 MG/5ML PO SUSP
15.0000 mL | Freq: Once | ORAL | Status: AC
  Administered 2019-02-22: 15 mL via ORAL
  Filled 2019-02-22: qty 30

## 2019-02-22 MED ORDER — ACETAMINOPHEN 500 MG PO TABS
1000.0000 mg | ORAL_TABLET | Freq: Once | ORAL | Status: AC
  Administered 2019-02-22: 1000 mg via ORAL
  Filled 2019-02-22: qty 2

## 2019-02-22 MED ORDER — FAMOTIDINE 20 MG PO TABS
20.0000 mg | ORAL_TABLET | Freq: Once | ORAL | Status: AC
  Administered 2019-02-22: 23:00:00 20 mg via ORAL
  Filled 2019-02-22: qty 1

## 2019-02-22 MED ORDER — LIDOCAINE VISCOUS HCL 2 % MT SOLN
15.0000 mL | Freq: Once | OROMUCOSAL | Status: AC
  Administered 2019-02-22: 15 mL via ORAL
  Filled 2019-02-22: qty 15

## 2019-02-22 NOTE — ED Notes (Signed)
Report given to Rachel RN

## 2019-02-22 NOTE — ED Provider Notes (Signed)
[facility] Emergency Department Provider Note   ____________________________________________   First MD Initiated Contact with Patient 02/22/19 2142     (approximate)  I have reviewed the triage vital signs and the nursing notes.   HISTORY  Chief Complaint Chest Pain    HPI Wanda Stevens is a 38 y.o. female with past medical history of hypertension, hyperlipidemia, and CAD who presents to the ED complaining of chest pain.  Patient reports onset of central burning chest pain earlier this evening while she was sitting down at work.  Pain has been constant since then, waxing and waning but not exacerbated or alleviated by anything in particular.  She denies any associated fevers, cough, or shortness of breath.  She does have a history of STEMI 2 months ago and she states the pain that was associated with that was much more severe and more of a squeezing.  She took a nitroglycerin prior to arrival with no relief and so decided to seek care in the ED.        Past Medical History:  Diagnosis Date  . Hyperlipidemia   . Hypertension   . MI (myocardial infarction) (HCC)   . Pre-diabetes     Patient Active Problem List   Diagnosis Date Noted  . STEMI involving oth coronary artery of inferior wall (HCC) 01/10/2019  . IUD strings lost 07/08/2017  . Elevated cholesterol with elevated triglycerides 10/15/2015  . Encounter for IUD insertion 04/05/2013  . Breast lump 02/15/2013  . Tobacco abuse 02/15/2013    Past Surgical History:  Procedure Laterality Date  . CORONARY STENT INTERVENTION N/A 01/10/2019   Procedure: CORONARY STENT INTERVENTION;  Surgeon: Marcina Millard, MD;  Location: [facility] INVASIVE CV LAB;  Service: Cardiovascular;  Laterality: N/A;  RCA  . CORONARY/GRAFT ACUTE MI REVASCULARIZATION N/A 01/10/2019   Procedure: Coronary/Graft Acute MI Revascularization;  Surgeon: Marcina Millard, MD;  Location: [facility] INVASIVE CV LAB;  Service:  Cardiovascular;  Laterality: N/A;  . LEFT HEART CATH AND CORONARY ANGIOGRAPHY N/A 01/10/2019   Procedure: LEFT HEART CATH AND CORONARY ANGIOGRAPHY;  Surgeon: Marcina Millard, MD;  Location: [facility] INVASIVE CV LAB;  Service: Cardiovascular;  Laterality: N/A;  . wisdome teeth      Prior to Admission medications   Medication Sig Start Date End Date Taking? Authorizing Provider  acetaminophen (TYLENOL) 325 MG tablet Take 2 tablets (650 mg total) by mouth every 4 (four) hours as needed for headache or mild pain. 01/12/19   Ramonita Lab, MD  aspirin EC 81 MG tablet Take 1 tablet (81 mg total) by mouth daily. 01/12/19 01/12/20  Ramonita Lab, MD  atorvastatin (LIPITOR) 80 MG tablet Take 1 tablet (80 mg total) by mouth daily at 6 PM. 01/12/19   Gouru, Aruna, MD  lisinopril (ZESTRIL) 2.5 MG tablet Take 1 tablet (2.5 mg total) by mouth daily. 01/13/19   Ramonita Lab, MD  metoprolol succinate (TOPROL-XL) 25 MG 24 hr tablet Take 0.5 tablets (12.5 mg total) by mouth at bedtime. 01/12/19   Gouru, Deanna Artis, MD  nicotine (NICODERM CQ - DOSED IN MG/24 HOURS) 14 mg/24hr patch Place 1 patch (14 mg total) onto the skin daily. 01/13/19   Gouru, Deanna Artis, MD  nitroGLYCERIN (NITROSTAT) 0.4 MG SL tablet Place 1 tablet (0.4 mg total) under the tongue every 5 (five) minutes as needed for chest pain. 01/12/19   Ramonita Lab, MD  ticagrelor (BRILINTA) 90 MG TABS tablet Take 1 tablet (90 mg total) by mouth 2 (two) times daily. 01/12/19  Nicholes Mango, MD    Allergies Other, Neomycin-bacitracin zn-polymyx, and Orange oil  Family History  Problem Relation Age of Onset  . Diabetes Mother   . Hyperlipidemia Mother   . CAD Father   . Hypertension Father   . Diabetes Father   . Hyperlipidemia Father     Social History Social History   Tobacco Use  . Smoking status: Current Every Day Smoker    Packs/day: 0.50  . Smokeless tobacco: Never Used  Substance Use Topics  . Alcohol use: Yes    Comment: seldom  . Drug use:  Never    Review of Systems  Constitutional: No fever/chills Eyes: No visual changes. ENT: No sore throat. Cardiovascular: Positive for chest pain. Respiratory: Denies shortness of breath. Gastrointestinal: No abdominal pain.  No nausea, no vomiting.  No diarrhea.  No constipation. Genitourinary: Negative for dysuria. Musculoskeletal: Negative for back pain. Skin: Negative for rash. Neurological: Negative for headaches, focal weakness or numbness.  ____________________________________________   PHYSICAL EXAM:  VITAL SIGNS: ED Triage Vitals  Enc Vitals Group     BP 02/22/19 1718 (!) 101/59     Pulse Rate 02/22/19 1718 89     Resp 02/22/19 1718 18     Temp 02/22/19 1718 98.6 F (37 C)     Temp Source 02/22/19 1718 Oral     SpO2 02/22/19 1718 99 %     Weight 02/22/19 1718 187 lb (84.8 kg)     Height 02/22/19 1718 5\' 4"  (1.626 m)     Head Circumference --      Peak Flow --      Pain Score 02/22/19 1717 6     Pain Loc --      Pain Edu? --      Excl. in [place]? --     Constitutional: Alert and oriented. Eyes: Conjunctivae are normal. Head: Atraumatic. Nose: No congestion/rhinnorhea. Mouth/Throat: Mucous membranes are moist. Neck: Normal ROM Cardiovascular: Normal rate, regular rhythm. Grossly normal heart sounds. Respiratory: Normal respiratory effort.  No retractions. Lungs CTAB. Gastrointestinal: Soft and nontender. No distention. Genitourinary: deferred Musculoskeletal: No lower extremity tenderness nor edema. Neurologic:  Normal speech and language. No gross focal neurologic deficits are appreciated. Skin:  Skin is warm, dry and intact. No rash noted. Psychiatric: Mood and affect are normal. Speech and behavior are normal.  ____________________________________________   LABS (all labs ordered are listed, but only abnormal results are displayed)  Labs Reviewed  BASIC METABOLIC PANEL - Abnormal; Notable for the following components:      Result Value   Glucose,  Bld 111 (*)    All other components within normal limits  CBC - Abnormal; Notable for the following components:   RBC 3.86 (*)    Hemoglobin 11.5 (*)    HCT 35.5 (*)    All other components within normal limits  POC URINE PREG, ED  POCT PREGNANCY, URINE  TROPONIN I (HIGH SENSITIVITY)  TROPONIN I (HIGH SENSITIVITY)   ____________________________________________  EKG  ED ECG REPORT I, Blake Divine, the attending physician, personally viewed and interpreted this ECG.   Date: 02/22/2019  EKG Time: 17:19  Rate: 91  Rhythm: normal sinus rhythm  Axis: Normal  Intervals:none  ST&T Change: T wave inversions inferiorly   PROCEDURES  Procedure(s) performed (including Critical Care):  Procedures   ____________________________________________   INITIAL IMPRESSION / ASSESSMENT AND PLAN / ED COURSE       38 year old female with a history of MI approximately 2 months ago presents  to the ED for central burning chest pain present over the past few hours.  EKG does show T wave inversions inferiorly, however this is similar to prior and there are no acute ischemic changes.  Pain seems very atypical clinically and potentially more consistent with GERD with gastritis.  Will trial GI cocktail.  Initial troponin is negative, plan to check repeat troponin now.  Case was discussed with Dr. Lady GaryFath of cardiology, who agrees that given atypical symptoms, she would be appropriate for discharge home and close follow-up with cardiology if repeat troponin within normal limits.  Repeat troponin within normal limits.  Patient reports improvement in symptoms following dose of Pepcid and Tylenol.  Counseled her to call the cardiology office in the morning to schedule close follow-up.  Counseled to return to the ED for new or worsening symptoms, patient agrees with plan.      ____________________________________________   FINAL CLINICAL IMPRESSION(S) / ED DIAGNOSES  Final diagnoses:  Atypical chest  pain  Coronary artery disease involving native coronary artery of native heart without angina pectoris     ED Discharge Orders    None       Note:  This document was prepared using Dragon voice recognition software and may include unintentional dictation errors.   Chesley NoonJessup, Eldred Lievanos, MD 02/22/19 [phone]

## 2019-02-22 NOTE — ED Triage Notes (Signed)
Reports centralized chest pain that is described as burning approx 20 minutes ago while sitting at work. Mild SOB since chest pain occurred. 2 nitro PTA. Did not help with pain. Pt was hx of recent heart attack.

## 2019-02-28 ENCOUNTER — Encounter: Payer: BLUE CROSS/BLUE SHIELD | Admitting: *Deleted

## 2019-02-28 ENCOUNTER — Other Ambulatory Visit: Payer: Self-pay

## 2019-02-28 DIAGNOSIS — I2111 ST elevation (STEMI) myocardial infarction involving right coronary artery: Secondary | ICD-10-CM

## 2019-02-28 DIAGNOSIS — Z955 Presence of coronary angioplasty implant and graft: Secondary | ICD-10-CM
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
## 2019-03-21 ENCOUNTER — Other Ambulatory Visit: Payer: Self-pay

## 2019-03-21 ENCOUNTER — Encounter: Payer: Self-pay | Attending: Cardiology | Admitting: *Deleted

## 2019-03-21 DIAGNOSIS — I2111 ST elevation (STEMI) myocardial infarction involving right coronary artery: Secondary | ICD-10-CM

## 2019-03-21 DIAGNOSIS — Z955 Presence of coronary angioplasty implant and graft: Secondary | ICD-10-CM

## 2019-03-21 NOTE — Progress Notes (Signed)
Completed virtual follow up appointment today.  Wanda Stevens is traveling this week and is in [place], [place] enjoying the 75 degree weather!!  She has continued to walk for exercise daily.  She struggles with counting her pulse but is able to focus on the RPE scale to follow along with her intensity.   Her weight went up some over the holidays but is back down now.  She continues to work on her weight loss. Her blood pressures have been good.  She is pleased with her progress.  She continues to work on quitting smoking.  Over the holidays and vacation, she has crept back up to 5 cigarettes a day, but she does use her fake cigarette and vape to help curb her cravings.    She is doing better with drinking more water.  She admits to cheating on her diet some while traveling.  She has been drinking her [facility] again but has also been doing better about her water intake as well.  She is trying to watch what she is eating as she still wants to lose more weight.   Next follow up scheduled for 1/20 with graduation on 1/27!!

## 2019-04-03 ENCOUNTER — Other Ambulatory Visit: Payer: Self-pay

## 2019-04-03 ENCOUNTER — Encounter: Payer: Self-pay | Admitting: *Deleted

## 2019-04-03 DIAGNOSIS — Z955 Presence of coronary angioplasty implant and graft: Secondary | ICD-10-CM

## 2019-04-03 DIAGNOSIS — I2111 ST elevation (STEMI) myocardial infarction involving right coronary artery: Secondary | ICD-10-CM

## 2019-04-03 NOTE — Progress Notes (Signed)
Completed virtual follow up today.  Wanda Stevens will be graduating next week!!    Lavonne is doing well with her exercise.  She is walking daily and at work.  She missed some tracking as she got kicked out of [employer] but is good now.    Her weight is steady around the mid 180s. Her pressures have been good. She is still around 3-5 cigarettes a day more due to stress than anything else as her brother has moved in close by and she is now helping with his three kids too.  One of which is a Research scientist (physical sciences) and she worries with him in the house.     Her Mountain Dew intake is down to only when she has caffeine headaches. Overall, her diet has greatly improved.

## 2019-04-11 ENCOUNTER — Other Ambulatory Visit: Payer: Self-pay

## 2019-04-11 ENCOUNTER — Encounter: Payer: Self-pay | Admitting: *Deleted

## 2019-04-11 DIAGNOSIS — Z955 Presence of coronary angioplasty implant and graft: Secondary | ICD-10-CM

## 2019-04-11 DIAGNOSIS — I2111 ST elevation (STEMI) myocardial infarction involving right coronary artery: Secondary | ICD-10-CM
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
## 2019-04-11 NOTE — Patient Instructions (Signed)
Discharge Patient Instructions  Patient Details  Name: Wanda Stevens MRN: [id number] Date of Birth: 04-Jun-1980 Referring Provider:  Isaias Cowman, MD   Reason for Discharge:  Patient reached a stable level of exercise. Patient independent in their exercise. Patient has met program and personal goals.  Smoking History:  Social History   Tobacco Use  Smoking Status Current Every Day Smoker  . Packs/day: 0.50  Smokeless Tobacco Never Used    Diagnosis:  ST elevation myocardial infarction involving right coronary artery Wanda Stevens)  Status post coronary artery stent placement  Initial Exercise Prescription: Initial Exercise Prescription - 01/24/19 1000      Date of Initial Exercise RX and Referring Provider   Date  01/24/19    Referring Provider  Isaias Cowman MD      Track   Minutes  15   walking and staff videos     Prescription Details   Frequency (times per week)  3    Duration  Progress to 30 minutes of continuous aerobic without signs/symptoms of physical distress      Intensity   THRR 40-80% of Max Heartrate  113-160    Ratings of Perceived Exertion  11-13    Perceived Dyspnea  0-4      Progression   Progression  Continue to progress workloads to maintain intensity without signs/symptoms of physical distress.      Resistance Training   Training Prescription  Yes    Weight  Body Weight/ROM    Reps  10-15       Personal Goals: Goals established at orientation with interventions provided to work toward goal. Personal Goals and Risk Factors at Admission - 01/24/19 1029      Core Components/Risk Factors/Patient Goals on Admission    Weight Management  Yes;Weight Loss    Intervention  Weight Management: Develop a combined nutrition and exercise program designed to reach desired caloric intake, while maintaining appropriate intake of nutrient and fiber, sodium and fats, and appropriate energy expenditure required for the weight goal.;Weight  Management: Provide education and appropriate resources to help participant work on and attain dietary goals.    Expected Outcomes  Short Term: Continue to assess and modify interventions until short term weight is achieved;Long Term: Adherence to nutrition and physical activity/exercise program aimed toward attainment of established weight goal;Weight Loss: Understanding of general recommendations for a balanced deficit meal plan, which promotes 1-2 lb weight loss per week and includes a negative energy balance of [phone] kcal/d;Understanding recommendations for meals to include 15-35% energy as protein, 25-35% energy from fat, 35-60% energy from carbohydrates, less than 247m of dietary cholesterol, 20-35 gm of total fiber daily;Understanding of distribution of calorie intake throughout the day with the consumption of 4-5 meals/snacks    Tobacco Cessation  Yes    Intervention  Assist the participant in steps to quit. Provide individualized education and counseling about committing to Tobacco Cessation, relapse prevention, and pharmacological support that can be provided by physician.;OAdvice worker assist with locating and accessing local/national Quit Smoking programs, and support quit date choice.    Expected Outcomes  Short Term: Will demonstrate readiness to quit, by selecting a quit date.;Short Term: Will quit all tobacco product use, adhering to prevention of relapse plan.;Long Term: Complete abstinence from all tobacco products for at least 12 months from quit date.    Lipids  Yes    Intervention  Provide education and support for participant on nutrition & aerobic/resistive exercise along with prescribed medications to achieve LDL <  40m, HDL >425m    Expected Outcomes  Short Term: Participant states understanding of desired cholesterol values and is compliant with medications prescribed. Participant is following exercise prescription and nutrition guidelines.;Long Term: Cholesterol  controlled with medications as prescribed, with individualized exercise RX and with personalized nutrition plan. Value goals: LDL < 7056mHDL > 40 mg.        Personal Goals Discharge: Goals and Risk Factor Review - 04/11/19 1018      Core Components/Risk Factors/Patient Goals Review   Personal Goals Review  Tobacco Cessation;Weight Management/Obesity;Hypertension    Review  She has gone from 208 lb to 185 lb!!  She has done this with exercise and improved eating.  She likes the app for reminders for meds and tracking BP.  She is still smoking some, but greatly improved and even now considering medications to help.    Expected Outcomes  Continue to work on weight loss and tobacco cessation.  Continue to monitor risk factors.       Exercise Goals and Review: Exercise Goals    Row Name 01/24/19 1029             Exercise Goals   Increase Physical Activity  Yes       Intervention  Provide advice, education, support and counseling about physical activity/exercise needs.;Develop an individualized exercise prescription for aerobic and resistive training based on initial evaluation findings, risk stratification, comorbidities and participant's personal goals.       Expected Outcomes  Short Term: Attend rehab on a regular basis to increase amount of physical activity.;Long Term: Add in home exercise to make exercise part of routine and to increase amount of physical activity.;Long Term: Exercising regularly at least 3-5 days a week.       Increase Strength and Stamina  Yes       Intervention  Provide advice, education, support and counseling about physical activity/exercise needs.;Develop an individualized exercise prescription for aerobic and resistive training based on initial evaluation findings, risk stratification, comorbidities and participant's personal goals.       Expected Outcomes  Short Term: Perform resistance training exercises routinely during rehab and add in resistance training at  home;Short Term: Increase workloads from initial exercise prescription for resistance, speed, and METs.;Long Term: Improve cardiorespiratory fitness, muscular endurance and strength as measured by increased METs and functional capacity (6MWT)       Able to understand and use rate of perceived exertion (RPE) scale  Yes       Intervention  Provide education and explanation on how to use RPE scale       Expected Outcomes  Short Term: Able to use RPE daily in rehab to express subjective intensity level;Long Term:  Able to use RPE to guide intensity level when exercising independently       Able to understand and use Dyspnea scale  Yes       Intervention  Provide education and explanation on how to use Dyspnea scale       Expected Outcomes  Short Term: Able to use Dyspnea scale daily in rehab to express subjective sense of shortness of breath during exertion;Long Term: Able to use Dyspnea scale to guide intensity level when exercising independently       Knowledge and understanding of Target Heart Rate Range (THRR)  Yes       Intervention  Provide education and explanation of THRR including how the numbers were predicted and where they are located for reference  Expected Outcomes  Short Term: Able to state/look up THRR;Short Term: Able to use daily as guideline for intensity in rehab;Long Term: Able to use THRR to govern intensity when exercising independently       Able to check pulse independently  Yes       Intervention  Provide education and demonstration on how to check pulse in carotid and radial arteries.;Review the importance of being able to check your own pulse for safety during independent exercise       Expected Outcomes  Short Term: Able to explain why pulse checking is important during independent exercise;Long Term: Able to check pulse independently and accurately       Understanding of Exercise Prescription  Yes       Intervention  Provide education, explanation, and written materials  on patient's individual exercise prescription       Expected Outcomes  Short Term: Able to explain program exercise prescription;Long Term: Able to explain home exercise prescription to exercise independently          Exercise Goals Re-Evaluation: Exercise Goals Re-Evaluation    Row Name 01/31/19 1108 02/14/19 1027 02/28/19 1039 03/21/19 1015 04/03/19 1335     Exercise Goal Re-Evaluation   Exercise Goals Review  Increase Physical Activity;Increase Strength and Stamina;Understanding of Exercise Prescription  Increase Physical Activity;Increase Strength and Stamina;Understanding of Exercise Prescription  Increase Physical Activity;Increase Strength and Stamina;Understanding of Exercise Prescription  Increase Physical Activity;Increase Strength and Stamina;Understanding of Exercise Prescription  Increase Physical Activity;Increase Strength and Stamina;Understanding of Exercise Prescription   Comments  Wanda Stevens is doing great with her home based rehab.  She is walking and even power walking while at work!  She is struggling with counting her pulse before and after her workout.  I encouraged her to continue to try to count her pulse and to use RPE as well to garner her intensity level.  She has not gotten her home exercise packet yet but will let me know if it doesn't come by the end of the week.  She has been tracking her steps and usually averages over 10,000 each day.  She got 20,000 on [place] Friday as she worked two jobs that day.  Her family just received an elliptical from her husband's job so we will start using that at home too.  She continues to walk some at home and is constantly moving still at work.  Wanda Stevens is traveling this week and is [place] enjoying the 75 degree weather!!  She has continued to walk for exercise daily.  She struggles with counting her pulse but is able to focus on the RPE scale to follow along with her intensity.  Wanda Stevens is doing well with her exercise.  She is walking  daily and at work.  She missed some tracking as she got kicked out of [place] but is good now.   Expected Outcomes  Short: Continue to make time for walking. Long: Continue to improve stamina.  Short: Start to add in elliptical  Long: Continue to improve strength  Short: Start to add in elliptical  Long: Continue to improve strength  Short: Continue to walk daily.  Long: Continue to improve stamina.  Short: Continue to walk daily.  Long: Continue to improve stamina.   [place]ams Name 04/11/19 1017             Exercise Goal Re-Evaluation   Exercise Goals Review  Increase Physical Activity;Increase Strength and Stamina;Understanding of Exercise Prescription  Comments  Wanda Stevens has done well in program.  She is going to continue to walk daily and learned how important that it is to do.       Expected Outcomes  Continue to exercise daily          Nutrition & Weight - Outcomes:    Nutrition: Nutrition Therapy & Goals - 01/24/19 1043      Nutrition Therapy   Diet  HH, low Na    Protein (specify units)  75g    Fiber  25 grams    Whole Grain Foods  3 servings    Saturated Fats  12 max. grams    Fruits and Vegetables  5 servings/day    Sodium  1.5 grams      Personal Nutrition Goals   Nutrition Goal  ST: cut down on mountain Dew LT: Eat a HH diet, live    Comments  No Breakfast. L at work: subway sandwhich or something off the hot bar at [place]. subway: oven roasted [place], cheese bread, oil, lettuce.  D: chicken, pork chops, steak. Vegetables + starch (green beans, broccoli, collards, etc.) + Rice (instant flavored ones), white pasta. Grilled, baked. (used to fry, but does not anymore). Butter in mac and cheese or some vegetables (tries to omit it when possible). Used to drink mountain dew and sweet tea. Now lemon tea and water. Snacks: snack pack of ritz. Garlic and black pepper to season, will sometimes use salt depending on the meal. Pt still wants to get away from [place], down to 3  cans/day, and would to continue to cut down. Pt reports not likeling whole grains - talked about some new ones she could try and found some specific items she can try at [place]. Pt reports using some olive oil, but it is expensive - she will try canola oil. Discussed general Hh eating.      Intervention Plan   Intervention  Prescribe, educate and counsel regarding individualized specific dietary modifications aiming towards targeted core components such as weight, hypertension, lipid management, diabetes, heart failure and other comorbidities.;Nutrition handout(s) given to patient.    Expected Outcomes  Short Term Goal: Understand basic principles of dietary content, such as calories, fat, sodium, cholesterol and nutrients.;Short Term Goal: A plan has been developed with personal nutrition goals set during dietitian appointment.;Long Term Goal: Adherence to prescribed nutrition plan.       Goals reviewed with patient; copy given to patient.
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
## 2019-04-11 NOTE — Progress Notes (Signed)
Completed virtual graduation appt.    Antwonette has done well in program.  She is going to continue to walk daily and learned how important that it is to do.    She has gone from 208 lb to 185 lb!!  She has done this with exercise and improved eating.  She likes the app for reminders for meds and tracking BP.  She is still smoking some, but greatly improved and even now considering medications to help.    She is eating better overall.  Her biggest take away has been learning how to eat better and make it part of her lifestyle overall.   Report from [employer].   Will send graduation packet.

## 2019-08-16 ENCOUNTER — Ambulatory Visit: Payer: Self-pay

## 2019-08-23 ENCOUNTER — Ambulatory Visit: Payer: Self-pay

## 2019-08-27 ENCOUNTER — Ambulatory Visit: Payer: Self-pay

## 2019-09-06 ENCOUNTER — Encounter: Payer: Self-pay | Admitting: Emergency Medicine

## 2019-09-06 ENCOUNTER — Other Ambulatory Visit: Payer: Self-pay

## 2019-09-06 ENCOUNTER — Emergency Department: Payer: Medicaid Other

## 2019-09-06 ENCOUNTER — Emergency Department
Admission: EM | Admit: 2019-09-06 | Discharge: 2019-09-06 | Disposition: A | Discharge status: Home / Self Care | Payer: Medicaid Other | Attending: Student | Admitting: Student

## 2019-09-06 DIAGNOSIS — R7303 Prediabetes: Secondary | ICD-10-CM | POA: Diagnosis not present

## 2019-09-06 DIAGNOSIS — I10 Essential (primary) hypertension: Secondary | ICD-10-CM | POA: Insufficient documentation

## 2019-09-06 DIAGNOSIS — Z7982 Long term (current) use of aspirin: Secondary | ICD-10-CM | POA: Insufficient documentation

## 2019-09-06 DIAGNOSIS — O4691 Antepartum hemorrhage, unspecified, first trimester: Secondary | ICD-10-CM | POA: Diagnosis not present

## 2019-09-06 DIAGNOSIS — Z3A11 11 weeks gestation of pregnancy: Secondary | ICD-10-CM | POA: Insufficient documentation

## 2019-09-06 DIAGNOSIS — R102 Pelvic and perineal pain: Secondary | ICD-10-CM | POA: Insufficient documentation

## 2019-09-06 DIAGNOSIS — F17210 Nicotine dependence, cigarettes, uncomplicated: Secondary | ICD-10-CM | POA: Diagnosis not present

## 2019-09-06 DIAGNOSIS — Z79899 Other long term (current) drug therapy: Secondary | ICD-10-CM | POA: Diagnosis not present

## 2019-09-06 DIAGNOSIS — O4690 Antepartum hemorrhage, unspecified, unspecified trimester: Secondary | ICD-10-CM

## 2019-09-06 LAB — BASIC METABOLIC PANEL
Anion gap: 8 (ref 5–15)
BUN: 7 mg/dL (ref 6–20)
CO2: 24 mmol/L (ref 22–32)
Calcium: 9.1 mg/dL (ref 8.9–10.3)
Chloride: 105 mmol/L (ref 98–111)
Creatinine, Ser: 0.66 mg/dL (ref 0.44–1.00)
GFR calc Af Amer: >60 mL/min (ref >60)
GFR calc non Af Amer: >60 mL/min (ref >60)
Glucose, Bld: 122 mg/dL — ABNORMAL HIGH (ref 70–99)
Potassium: 3.7 mmol/L (ref 3.5–5.1)
Sodium: 137 mmol/L (ref 135–145)

## 2019-09-06 LAB — CBC
HCT: 35.8 % — ABNORMAL LOW (ref 36.0–46.0)
Hemoglobin: 12.5 g/dL (ref 12.0–15.0)
MCH: 30.8 pg (ref 26.0–34.0)
MCHC: 34.9 g/dL (ref 30.0–36.0)
MCV: 88.2 fL (ref 80.0–100.0)
Platelets: 181 10*3/uL (ref 150–400)
RBC: 4.06 MIL/uL (ref 3.87–5.11)
RDW: 14.6 % (ref 11.5–15.5)
WBC: 6.8 10*3/uL (ref 4.0–10.5)
nRBC: 0 % (ref 0.0–0.2)

## 2019-09-06 LAB — TYPE AND SCREEN
ABO/RH(D): A POS
Antibody Screen: NEGATIVE

## 2019-09-06 LAB — HCG, QUANTITATIVE, PREGNANCY: hCG, Beta Chain, Quant, S: 73273 m[IU]/mL — ABNORMAL HIGH (ref <5)

## 2019-09-06 NOTE — Discharge Instructions (Signed)
Thank you for letting us take care of you in the emergency department today.   Please continue to take any regular, prescribed medications.   Please follow up with: Your OB/GYN   Please return to the ER for any new or worsening symptoms. This includes worsening pain, continued heavy bleeding (going through more than 1 pad/hour for greater than 2-3 hours in a row), lightheadedness, or any other signs/symptoms that are concerning to you.

## 2019-09-06 NOTE — ED Notes (Signed)
Pt taken to ultrasound by tech

## 2019-09-06 NOTE — ED Triage Notes (Signed)
Patient reports she is [redacted] weeks pregnant. States she woke up this morning and is having vaginal bleeding with some clots as well as intermittent abdominal cramping.

## 2019-09-06 NOTE — ED Provider Notes (Signed)
[facility] Emergency Department Provider Note  ____________________________________________   First MD Initiated Contact with Patient 09/06/19 [phone]     (approximate)  I have reviewed the triage vital signs and the nursing notes.  History  Chief Complaint Vaginal Bleeding    HPI Wanda BIGBEE is a 39 y.o. female who presents to the ER for vaginal bleeding in early pregnancy. Patient ~[redacted] weeks pregnant. This AM felt like she had a "gush" of blood when she went to the restroom. Noticed a blood clot as well. Has only needed 1 pad since this AM. Associated w/ some mild lower pelvic pressure/cramping. On Plavix, no other thinners. No other vaginal discharge. Just had her first prenatal visit on 6/21.   LMP 06/20/19 Follows with [facility] Hx multiple prior miscarriages   Past Medical Hx Past Medical History:  Diagnosis Date  . Hyperlipidemia   . Hypertension   . MI (myocardial infarction) ([place])   . Pre-diabetes     Problem List Patient Active Problem List   Diagnosis Date Noted  . STEMI involving oth coronary artery of inferior wall ([place]) 01/10/2019  . IUD strings lost 07/08/2017  . Elevated cholesterol with elevated triglycerides 10/15/2015  . Encounter for IUD insertion 04/05/2013  . Breast lump 02/15/2013  . Tobacco abuse 02/15/2013    Past Surgical Hx Past Surgical History:  Procedure Laterality Date  . CORONARY STENT INTERVENTION N/A 01/10/2019   Procedure: CORONARY STENT INTERVENTION;  Surgeon: Isaias Cowman, MD;  Location: [facility];  Service: Cardiovascular;  Laterality: N/A;  RCA  . CORONARY/GRAFT ACUTE MI REVASCULARIZATION N/A 01/10/2019   Procedure: Coronary/Graft Acute MI Revascularization;  Surgeon: Isaias Cowman, MD;  Location: [facility];  Service: Cardiovascular;  Laterality: N/A;  . LEFT HEART CATH AND CORONARY ANGIOGRAPHY N/A 01/10/2019   Procedure: LEFT HEART CATH AND CORONARY ANGIOGRAPHY;   Surgeon: Isaias Cowman, MD;  Location: [facility];  Service: Cardiovascular;  Laterality: N/A;  . wisdome teeth      Medications Prior to Admission medications   Medication Sig Start Date End Date Taking? Authorizing Provider  acetaminophen (TYLENOL) 325 MG tablet Take 2 tablets (650 mg total) by mouth every 4 (four) hours as needed for headache or mild pain. 01/12/19   Nicholes Mango, MD  aspirin EC 81 MG tablet Take 1 tablet (81 mg total) by mouth daily. 01/12/19 01/12/20  Nicholes Mango, MD  atorvastatin (LIPITOR) 80 MG tablet Take 1 tablet (80 mg total) by mouth daily at 6 PM. 01/12/19   Gouru, Aruna, MD  lisinopril (ZESTRIL) 2.5 MG tablet Take 1 tablet (2.5 mg total) by mouth daily. 01/13/19   Nicholes Mango, MD  metoprolol succinate (TOPROL-XL) 25 MG 24 hr tablet Take 0.5 tablets (12.5 mg total) by mouth at bedtime. 01/12/19   Gouru, Illene Silver, MD  nicotine (NICODERM CQ - DOSED IN MG/24 HOURS) 14 mg/24hr patch Place 1 patch (14 mg total) onto the skin daily. 01/13/19   Gouru, Illene Silver, MD  nitroGLYCERIN (NITROSTAT) 0.4 MG SL tablet Place 1 tablet (0.4 mg total) under the tongue every 5 (five) minutes as needed for chest pain. 01/12/19   Nicholes Mango, MD  ticagrelor (BRILINTA) 90 MG TABS tablet Take 1 tablet (90 mg total) by mouth 2 (two) times daily. 01/12/19   Nicholes Mango, MD    Allergies Other, Neomycin-bacitracin zn-polymyx, and Orange oil  Family Hx Family History  Problem Relation Age of Onset  . Diabetes Mother   . Hyperlipidemia Mother   .  CAD Father   . Hypertension Father   . Diabetes Father   . Hyperlipidemia Father     Social Hx Social History   Tobacco Use  . Smoking status: Current Every Day Smoker    Packs/day: 0.50  . Smokeless tobacco: Never Used  Substance Use Topics  . Alcohol use: Yes    Comment: seldom  . Drug use: Never     Review of Systems  Constitutional: Negative for fever. Negative for chills. Eyes: Negative for visual  changes. ENT: Negative for sore throat. Cardiovascular: Negative for chest pain. Respiratory: Negative for shortness of breath. Gastrointestinal: Negative for nausea. Negative for vomiting.  Genitourinary: + vaginal bleeding in pregnancy Musculoskeletal: Negative for leg swelling. Skin: Negative for rash. Neurological: Negative for headaches.   Physical Exam  Vital Signs: ED Triage Vitals [09/06/19 0723]  Enc Vitals Group     BP 114/72     Pulse Rate 93     Resp 16     Temp 98.6 F (37 C)     Temp Source Oral     SpO2 99 %     Weight 191 lb (86.6 kg)     Height 5\' 4"  (1.626 m)     Head Circumference      Peak Flow      Pain Score 1     Pain Loc      Pain Edu?      Excl. in GC?     Constitutional: Alert and oriented. Well appearing. NAD.  Head: Normocephalic. Atraumatic. Eyes: Conjunctivae clear. Sclera anicteric. Pupils equal and symmetric. Nose: No masses or lesions. No congestion or rhinorrhea. Mouth/Throat: Wearing mask.  Neck: No stridor. Trachea midline.  Cardiovascular: Normal rate. Extremities well perfused. Respiratory: Normal respiratory effort.   Gastrointestinal: Soft. Non-distended. Non-tender.  Genitourinary: Deferred. Offerred to patient, but she just had a prenatal visit w/ speculum exam and pap smear on 6/21. Feel this is reasonable. Musculoskeletal: No lower extremity edema. No deformities. Neurologic:  Normal speech and language. No gross focal or lateralizing neurologic deficits are appreciated.  Skin: Skin is warm, dry and intact. No rash noted. Psychiatric: Mood and affect are appropriate for situation.   Radiology  Personally reviewed available imaging myself.   OB 7/21 - IMPRESSION:  1. Single intrauterine gestation, 11 weeks 1 day by crown-rump  length with fetal heart rate of 173 beats per minute and small  subchorionic hemorrhage.    Procedures  Procedure(s) performed (including critical care):  Procedures   Initial Impression  / Assessment and Plan / MDM / ED Course  39 y.o. female who presents to the ED for vaginal bleeding in early pregnancy  Ddx: threatened miscarriage, miscarriage, subchorionic hemorrhage   Will plan for labs, imaging  Ultrasound reveals single IUP, measuring consistent with her dating.  Appropriate fetal heart rate.  Small subchorionic hemorrhage, likely etiology of her bleeding.  Normal hemoglobin.  Blood type is a positive, does not require RhoGam. lAs such, feel patient is stable for d/c with outpatient follow up. She is in agreement. Discussed return precautions, including increased pain, bleeding, etc. She voices understanding. Stable for discharge.      _______________________________   As part of my medical decision making I have reviewed available labs, radiology tests, reviewed old records/performed chart review.    Final Clinical Impression(s) / ED Diagnosis  Final diagnoses:  Vaginal bleeding in pregnancy       Note:  This document was prepared using Dragon voice recognition software and  may include unintentional dictation errors.   Miguel Aschoff., MD 09/07/19 1029

## 2019-09-06 NOTE — ED Notes (Signed)
Pt reports vaginal bleeding that started this morning. Pt states that she is about [redacted] weeks pregnant. Pt has had multiple miscarriages in the past. Pt states that she has mild pressure in her lower abdomen. Pt states last sexual intercourse was a few weeks ago. Pt sees [facility]. Pt states that at her last appt they were able to doppler FHT.

## 2019-09-06 NOTE — ED Notes (Signed)
Dr. Monks at bedside 

## 2020-05-06 IMAGING — CR DG CHEST 2V
1 series · 2 of 2 positions shown · non-contrast
Comparison: None.

CLINICAL DATA: Shortness of breath

EXAM:
CHEST - 2 VIEW

[Series 1: w chest pa · 0.14mm/px · 2 of 2 slices shown]
[im 1/2]
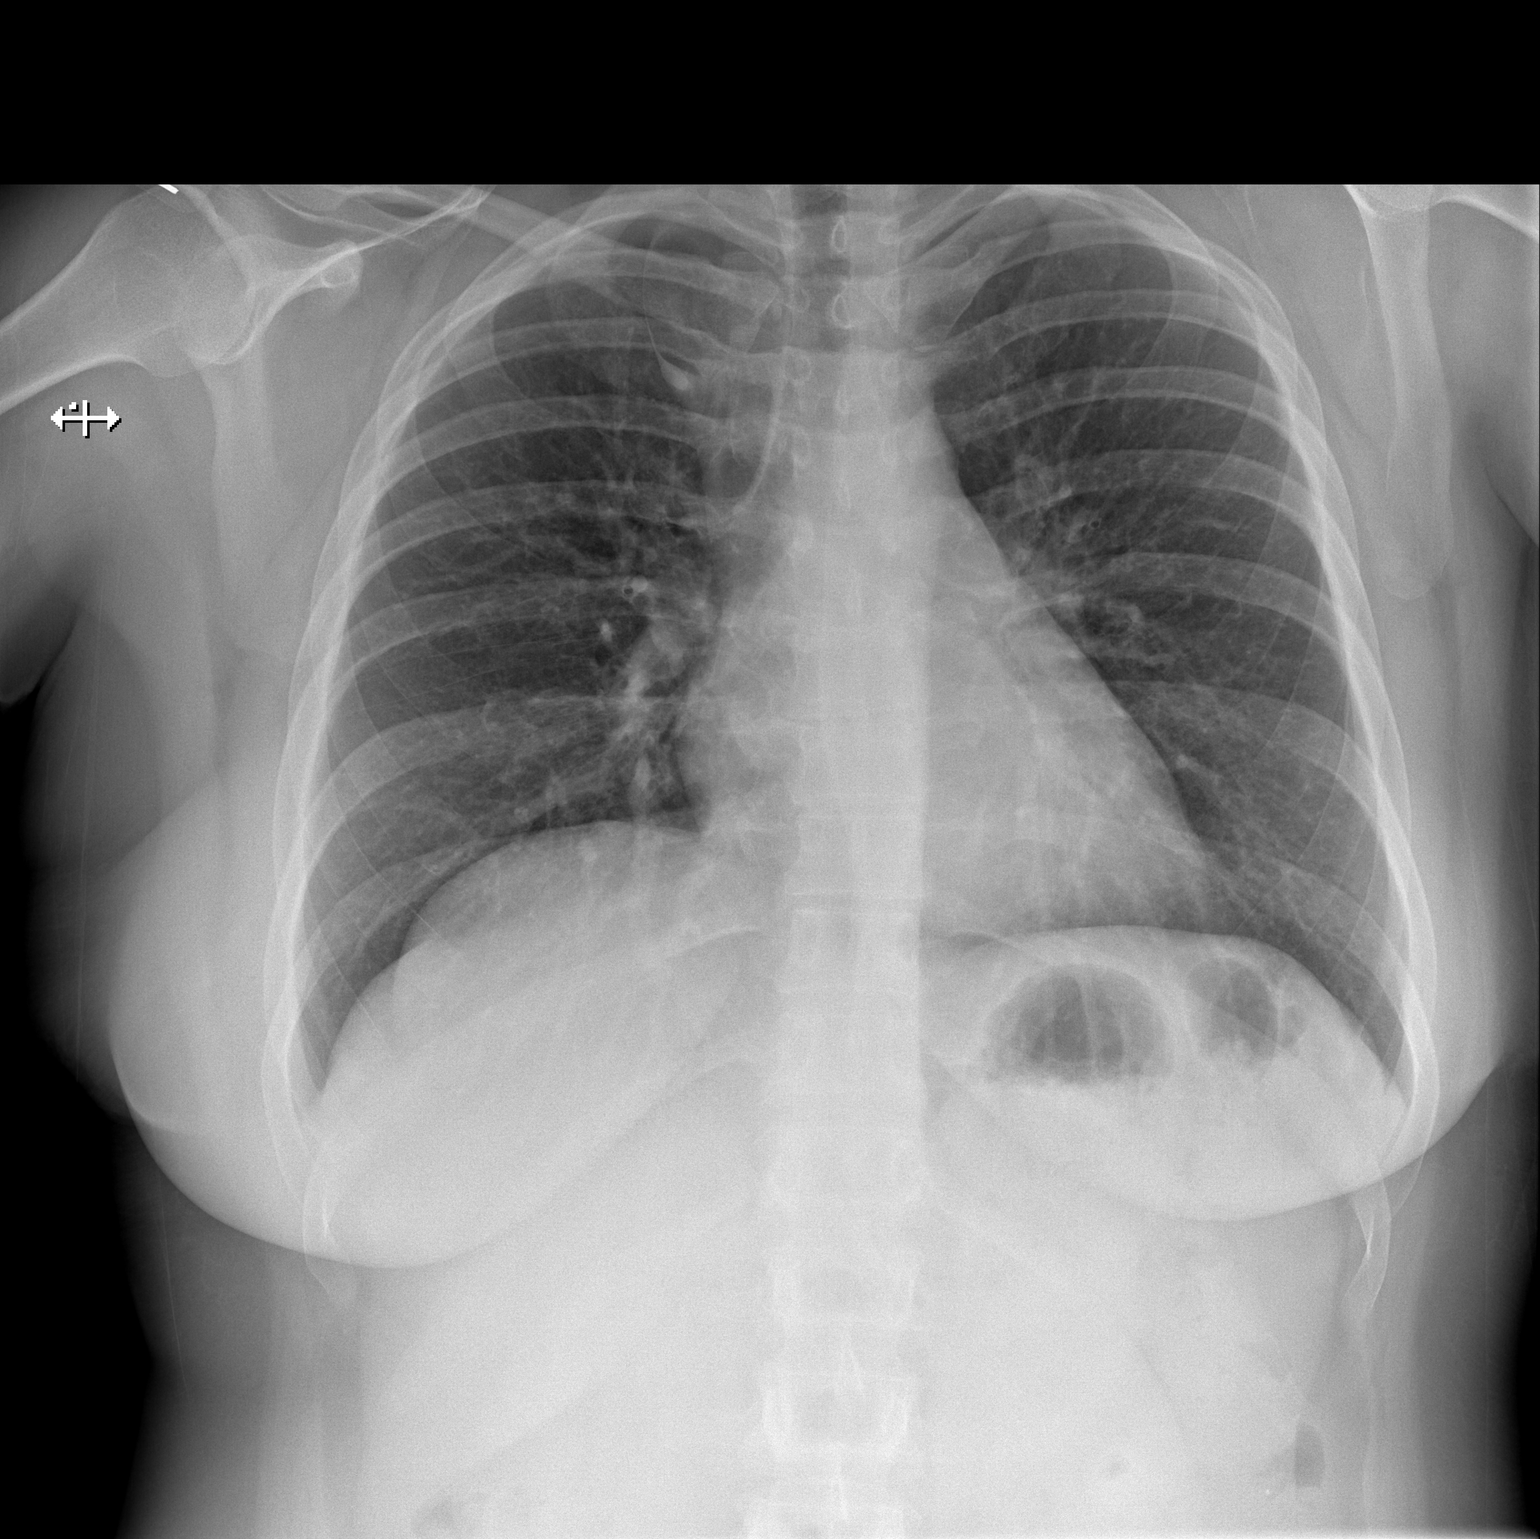
[im 2/2]
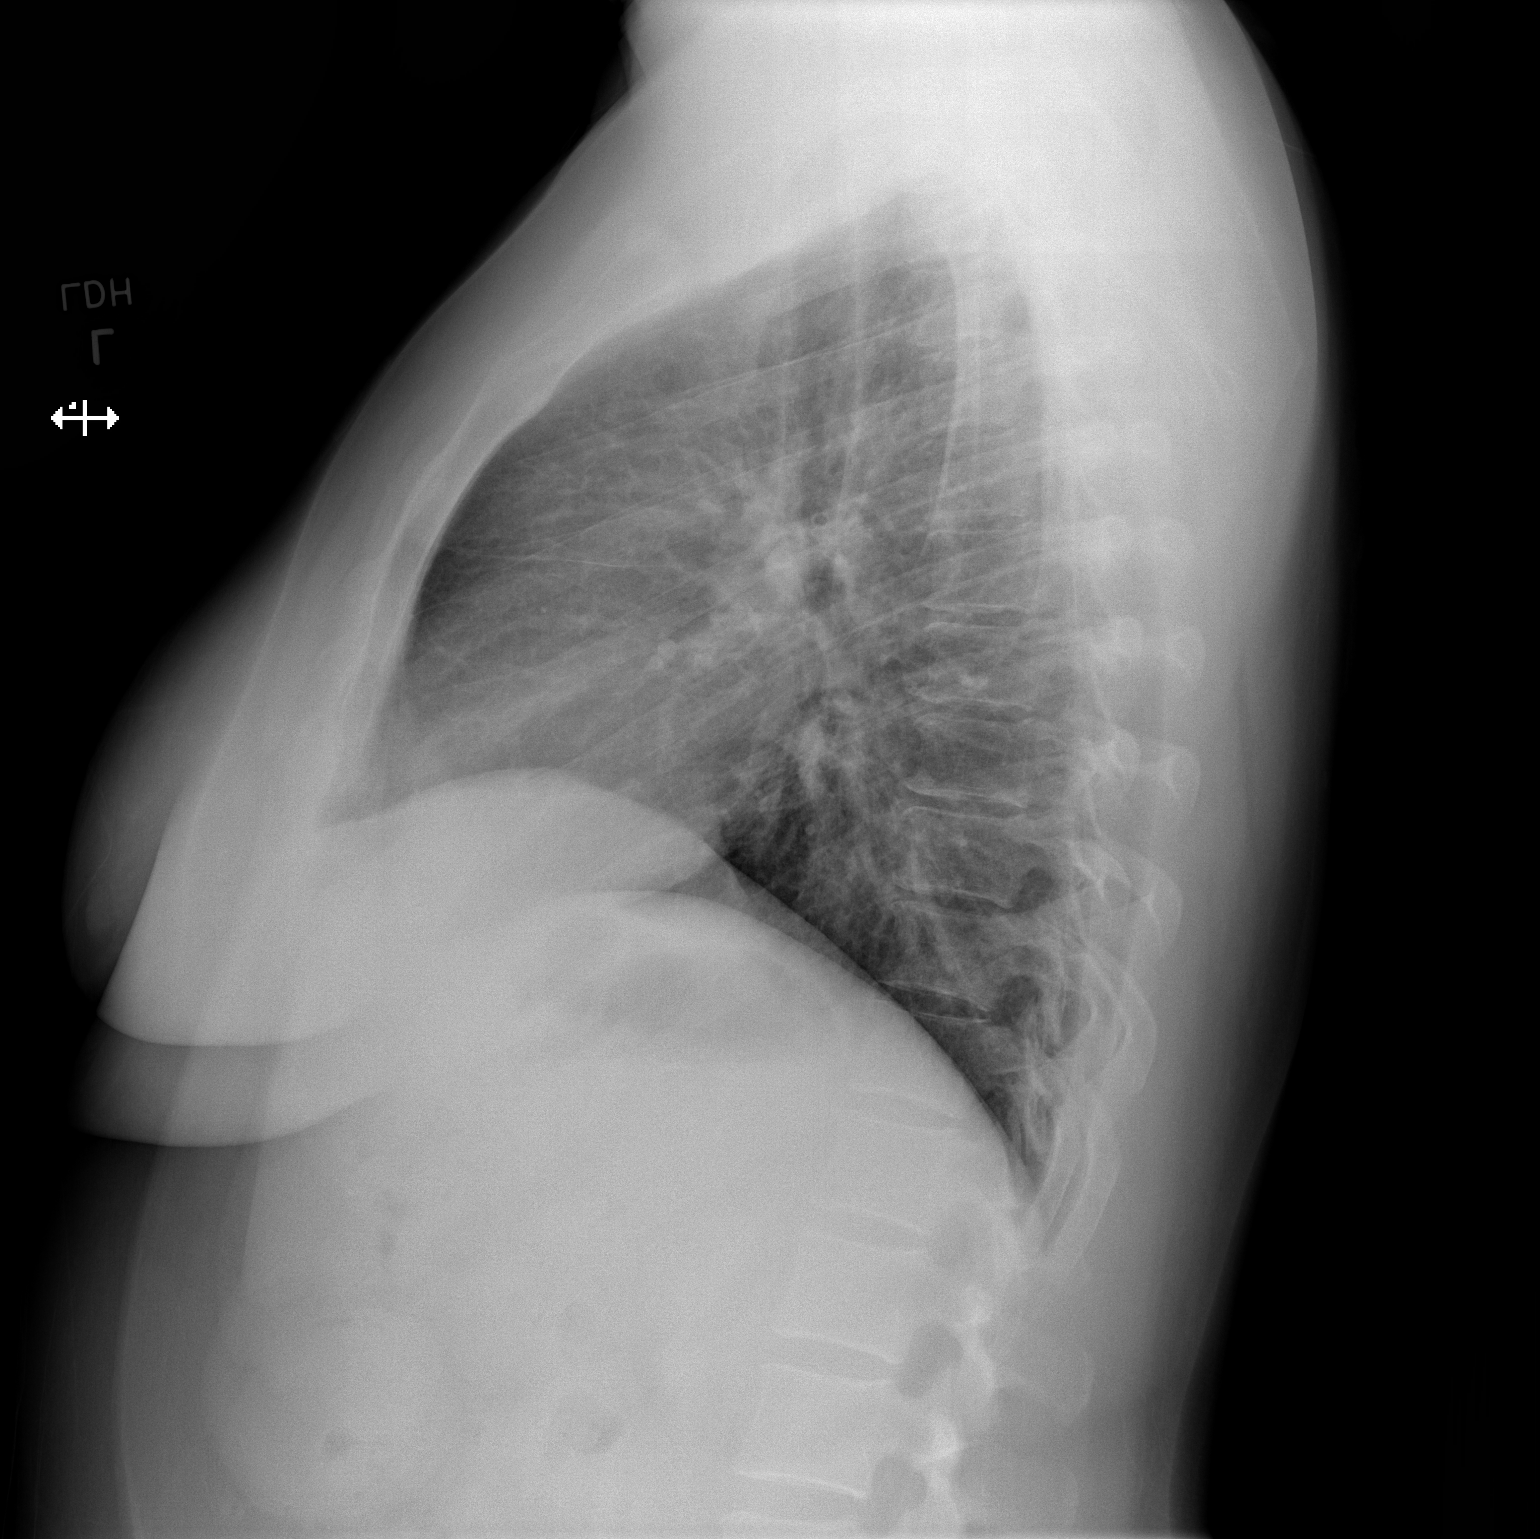

[2 of 2 positions shown; findings below may reference images not displayed]

FINDINGS: The heart size and mediastinal contours are within normal limits.
Both lungs are clear. The visualized skeletal structures are
unremarkable.
IMPRESSION: No active cardiopulmonary disease.

## 2020-11-18 IMAGING — US US OB COMP LESS 14 WK
1 series · 14 of 28 positions shown · non-contrast
Comparison: None

CLINICAL DATA: Vaginal bleeding since this morning, last menstrual
period 06/20/2019, gestational age by last menstrual period 11 weeks
1 day. Quantitative beta HCG of 73,273

EXAM:
OBSTETRIC <14 WK ULTRASOUND
TECHNIQUE: Transabdominal ultrasound was performed for evaluation of the
gestation as well as the maternal uterus and adnexal regions.

[Series 1: us ob comp less 14 wks · 14 of 49 slices shown]
[im 2/49]
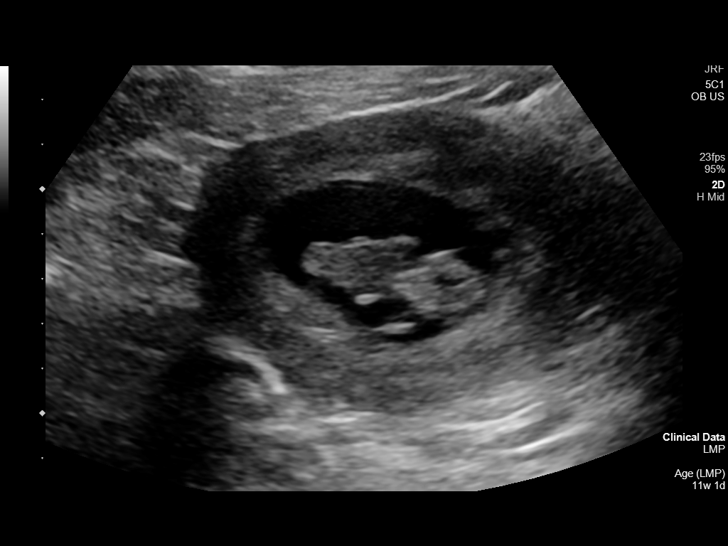
[im 6/49]
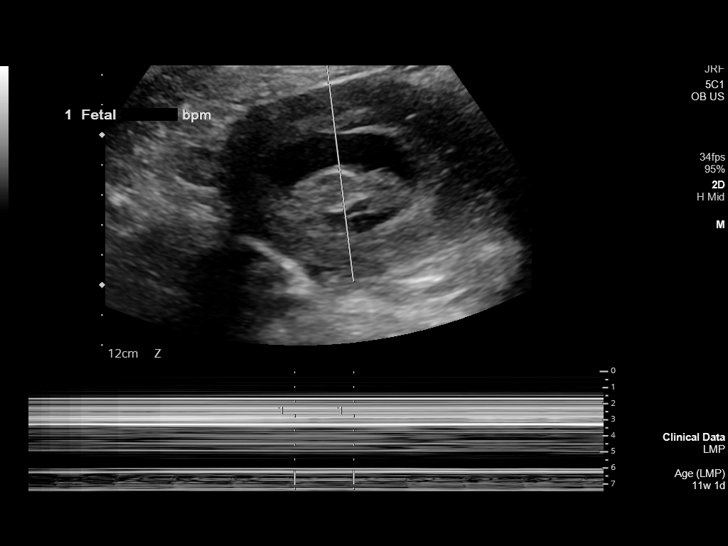
[im 9/49]
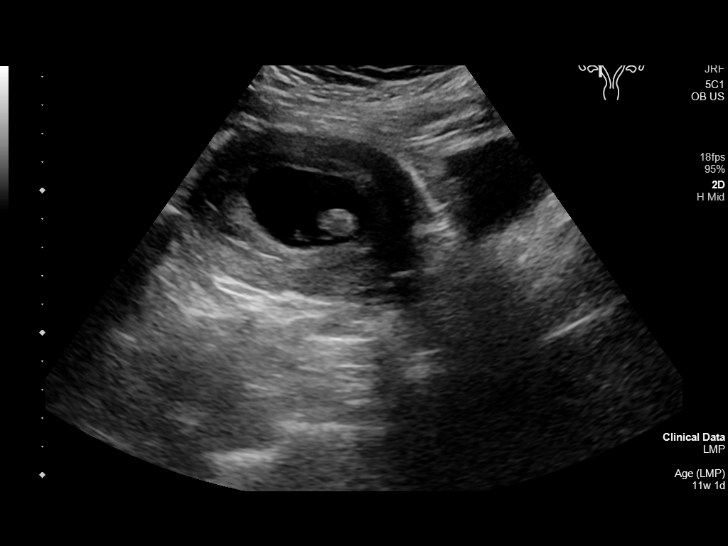
[im 13/49]
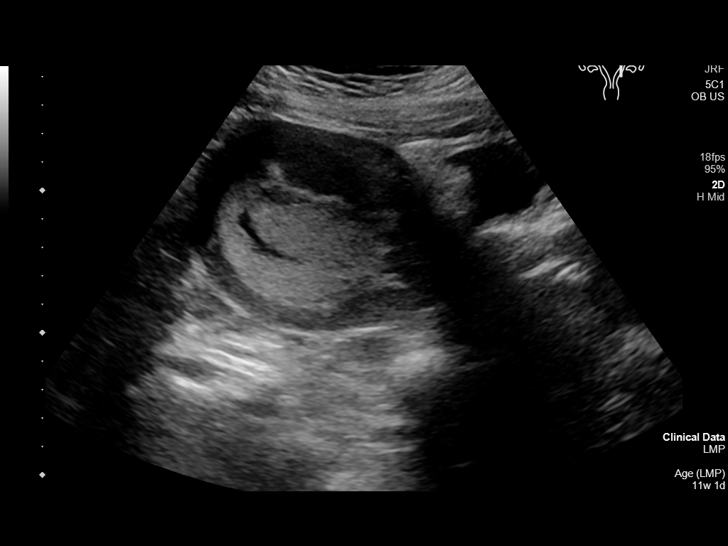
[im 17/49]
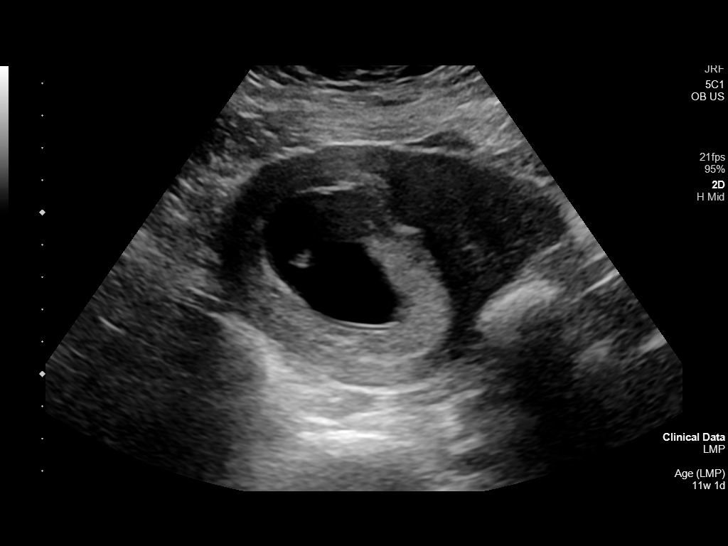
[im 20/49]
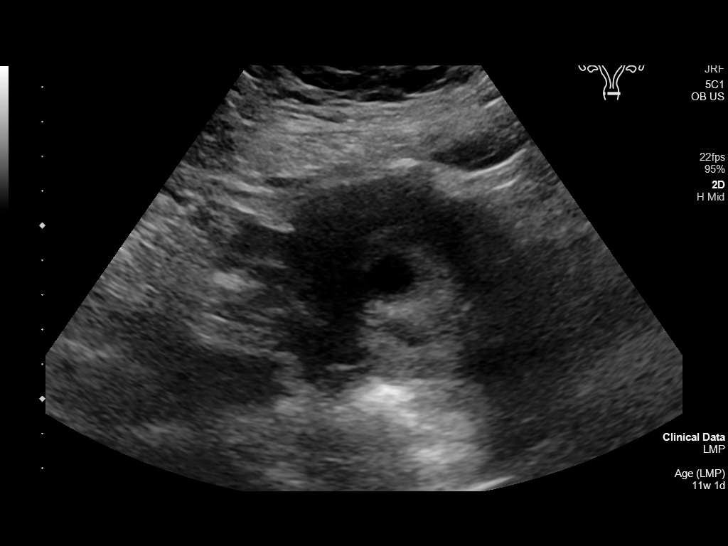
[im 24/49]
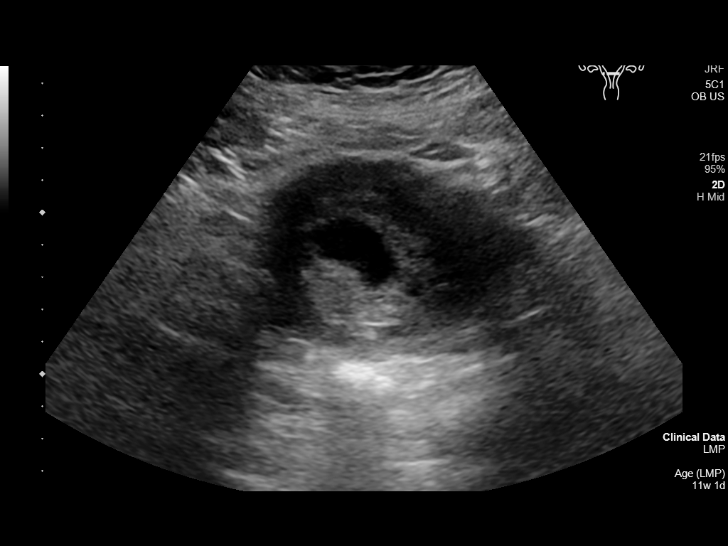
[im 27/49]
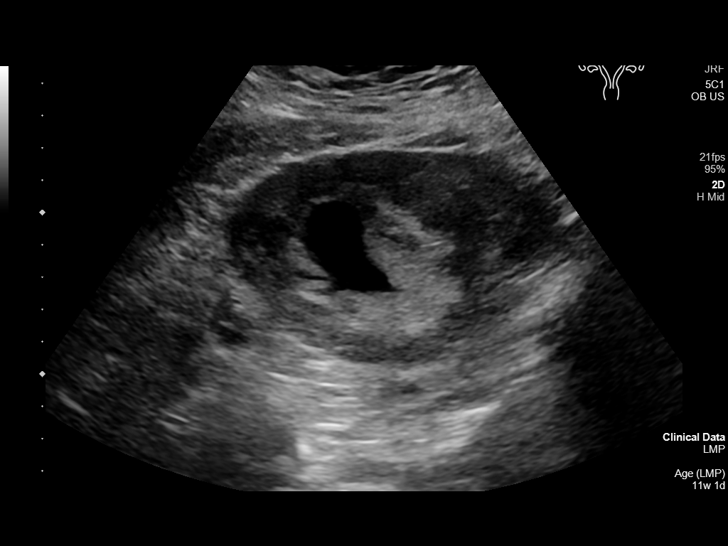
[im 31/49]
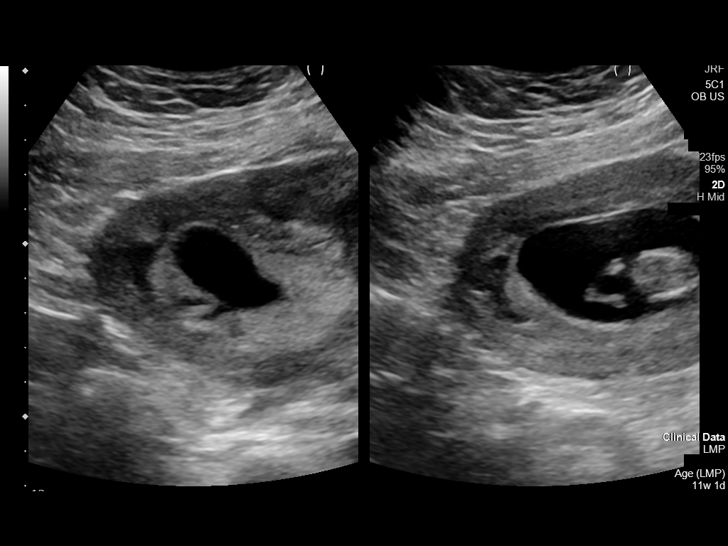
[im 34/49]
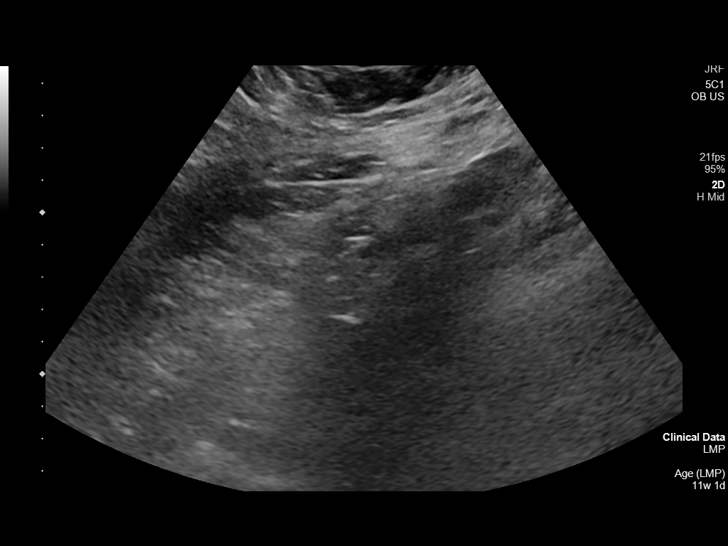
[im 38/49]
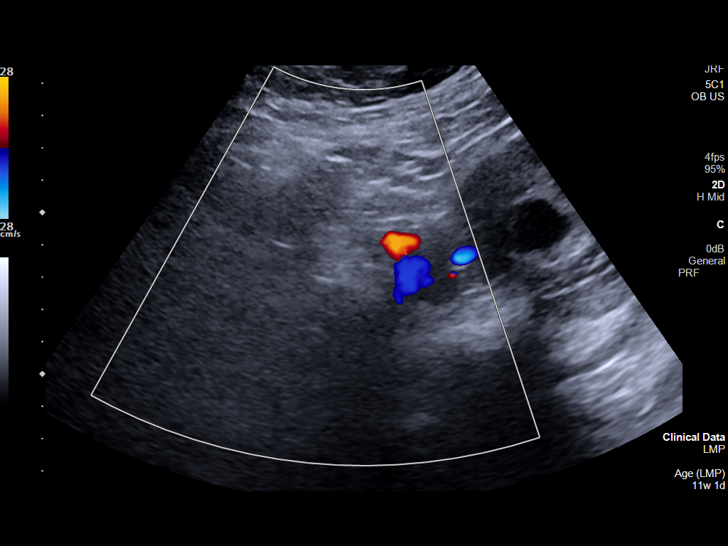
[im 41/49]
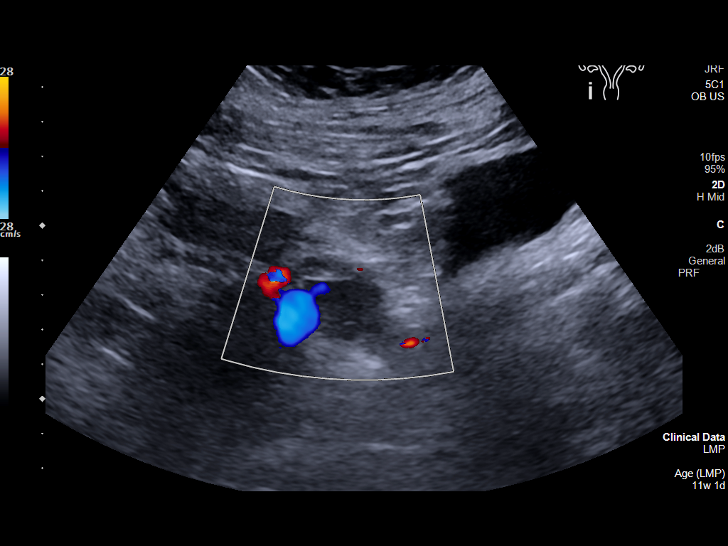
[im 45/49]
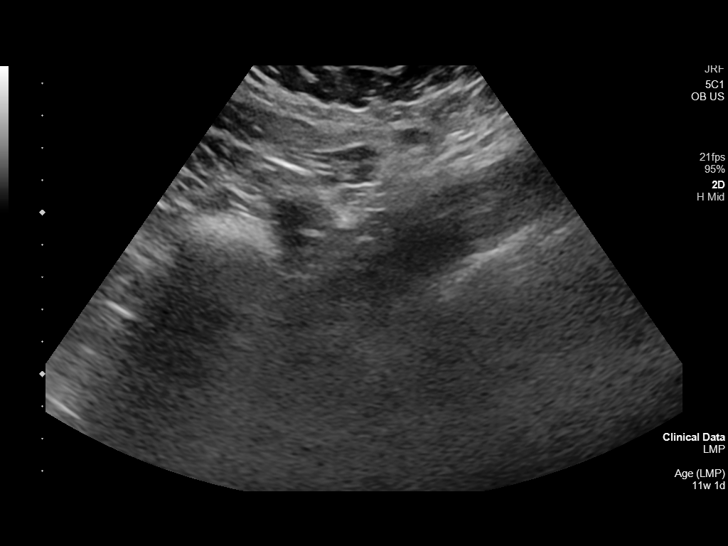
[im 49/49]
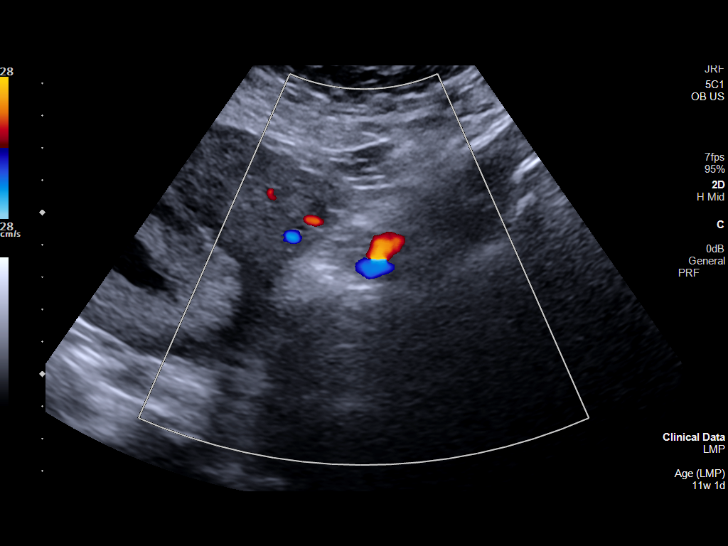

[14 of 28 positions shown; findings below may reference images not displayed]

FINDINGS: Intrauterine gestational sac: Single

Yolk sac:  Not Visualized.

Embryo:  Visualized.

Cardiac Activity: Visualized

Heart Rate: 173 bpm

CRL: 42.9 mm   11 w 1 d                  US EDC: 04/05/2020

Subchorionic hemorrhage: Small subchorionic hemorrhage in the lower
uterine segment and small area in the fundus

Maternal uterus/adnexae: No free fluid. Normal RIGHT ovary. LEFT
ovary not visualized.
IMPRESSION: 1. Single intrauterine gestation, 11 weeks 1 day by crown-rump
length with fetal heart rate of 173 beats per minute and small
subchorionic hemorrhage.

## 2022-03-08 ENCOUNTER — Emergency Department
Admission: EM | Admit: 2022-03-08 | Discharge: 2022-03-08 | Disposition: A | Discharge status: Home / Self Care | Payer: Commercial Managed Care - PPO | Attending: Emergency Medicine | Admitting: Emergency Medicine

## 2022-03-08 ENCOUNTER — Other Ambulatory Visit: Payer: Self-pay

## 2022-03-08 ENCOUNTER — Emergency Department: Payer: Commercial Managed Care - PPO

## 2022-03-08 DIAGNOSIS — B349 Viral infection, unspecified: Secondary | ICD-10-CM

## 2022-03-08 DIAGNOSIS — Z1152 Encounter for screening for COVID-19: Secondary | ICD-10-CM | POA: Insufficient documentation

## 2022-03-08 DIAGNOSIS — R000 Tachycardia, unspecified: Secondary | ICD-10-CM | POA: Diagnosis not present

## 2022-03-08 DIAGNOSIS — F17200 Nicotine dependence, unspecified, uncomplicated: Secondary | ICD-10-CM | POA: Insufficient documentation

## 2022-03-08 DIAGNOSIS — R0789 Other chest pain: Secondary | ICD-10-CM | POA: Insufficient documentation

## 2022-03-08 DIAGNOSIS — R0602 Shortness of breath: Secondary | ICD-10-CM | POA: Diagnosis present

## 2022-03-08 LAB — CBC
HCT: 42.1 % (ref 36.0–46.0)
Hemoglobin: 13.8 g/dL (ref 12.0–15.0)
MCH: 29.2 pg (ref 26.0–34.0)
MCHC: 32.8 g/dL (ref 30.0–36.0)
MCV: 89.2 fL (ref 80.0–100.0)
Platelets: 200 10*3/uL (ref 150–400)
RBC: 4.72 MIL/uL (ref 3.87–5.11)
RDW: 14.9 % (ref 11.5–15.5)
WBC: 5.9 10*3/uL (ref 4.0–10.5)
nRBC: 0 % (ref 0.0–0.2)

## 2022-03-08 LAB — RESP PANEL BY RT-PCR (RSV, FLU A&B, COVID)  RVPGX2
Influenza A by PCR: NEGATIVE
Influenza B by PCR: NEGATIVE
Resp Syncytial Virus by PCR: NEGATIVE
SARS Coronavirus 2 by RT PCR: NEGATIVE

## 2022-03-08 LAB — BASIC METABOLIC PANEL
Anion gap: 8 (ref 5–15)
BUN: 7 mg/dL (ref 6–20)
CO2: 23 mmol/L (ref 22–32)
Calcium: 9.1 mg/dL (ref 8.9–10.3)
Chloride: 107 mmol/L (ref 98–111)
Creatinine, Ser: 0.89 mg/dL (ref 0.44–1.00)
GFR, Estimated: >60 mL/min (ref >60)
Glucose, Bld: 119 mg/dL — ABNORMAL HIGH (ref 70–99)
Potassium: 3.4 mmol/L — ABNORMAL LOW (ref 3.5–5.1)
Sodium: 138 mmol/L (ref 135–145)

## 2022-03-08 LAB — D-DIMER, QUANTITATIVE: D-Dimer, Quant: 0.3 ug/mL-FEU (ref 0.00–0.50)

## 2022-03-08 LAB — POC URINE PREG, ED: Preg Test, Ur: NEGATIVE

## 2022-03-08 LAB — TROPONIN I (HIGH SENSITIVITY)
Troponin I (High Sensitivity): <2 ng/L (ref <18)
Troponin I (High Sensitivity): <2 ng/L (ref <18)

## 2022-03-08 MED ORDER — LIDOCAINE 5 % EX PTCH: 1.0000 | MEDICATED_PATCH | Freq: Two times a day (BID) | CUTANEOUS | 0 refills | Status: AC

## 2022-03-08 MED ORDER — SODIUM CHLORIDE 0.9 % IV BOLUS
500.0000 mL | Freq: Once | INTRAVENOUS | Status: AC
  Administered 2022-03-08: 500 mL via INTRAVENOUS

## 2022-03-08 MED ORDER — LIDOCAINE 5 % EX PTCH
1.0000 | MEDICATED_PATCH | CUTANEOUS | Status: DC
  Administered 2022-03-08: 1 via TRANSDERMAL
  Filled 2022-03-08: qty 1

## 2022-03-08 MED ORDER — KETOROLAC TROMETHAMINE 15 MG/ML IJ SOLN
15.0000 mg | Freq: Once | INTRAMUSCULAR | Status: AC
  Administered 2022-03-08: 15 mg via INTRAVENOUS
  Filled 2022-03-08: qty 1

## 2022-03-08 NOTE — Discharge Instructions (Addendum)
Please call your cardiologist to make a follow-up appointment return to the ER if you develop worsening symptoms or any other concerns.

## 2022-03-08 NOTE — ED Triage Notes (Signed)
PT states coming in with chest pain for the last several days. Pt states a previous MI with stent placement.  Pt states 6/10 chest pain. Pt states pain is tightening to the center of the chest

## 2022-03-08 NOTE — ED Provider Notes (Signed)
[facility] Provider Note    Event Date/Time   First MD Initiated Contact with Patient 03/08/22 2001     (approximate)   History   Chest Pain   HPI  Wanda Stevens is a 41 y.o. 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  On review of records patient was followed by [facility] cardiology on 05/21/2021.  She had a STEMI in 2020 underwent cardiac cath with 99% stenosis of RCA and 60 of LAD and had stent placed to RCA.  Patient reports that she has not had any exertional chest pain.  She reports that since yesterday she had a cough and stuffy nose.  She reports testing herself for COVID and it was negative.  Then today around 9 AM she developed some chest tightness.  She reports some pain with pressing on the right side of her chest as well little bit of shortness of breath and pain with breathing.  Denies any history of blood clots.  She does report that she is a daily smoker.  Patient reports chest pain over the past several days   Physical Exam   Triage Vital Signs: ED Triage Vitals  Enc Vitals Group     BP 03/08/22 1746 (!) 137/95     Pulse Rate 03/08/22 1746 (!) 107     Resp 03/08/22 1746 20     Temp 03/08/22 1746 98.4 F (36.9 C)     Temp Source 03/08/22 1746 Oral     SpO2 03/08/22 1746 96 %     Weight 03/08/22 1748 210 lb (95.3 kg)     Height 03/08/22 1748 5\' 4"  (1.626 m)     Head Circumference --      Peak Flow --      Pain Score 03/08/22 1747 6     Pain Loc --      Pain Edu? --      Excl. in GC? --     Most recent vital signs: Vitals:   03/08/22 1746  BP: (!) 137/95  Pulse: (!) 107  Resp: 20  Temp: 98.4 F (36.9 C)  SpO2: 96%     General: Awake, no distress.  CV:  Good peripheral perfusion.  Resp:  Normal effort.  Abd:  No distention.  Other:  Tenderness with pushing on the right chest wall.  No wheezing noted.  Slightly tachycardic.  No rash noted.  No swelling in legs.  No calf  tenderness   ED Results / Procedures / Treatments   Labs (all labs ordered are listed, but only abnormal results are displayed) Labs Reviewed  BASIC METABOLIC PANEL - Abnormal; Notable for the following components:      Result Value   Potassium 3.4 (*)    Glucose, Bld 119 (*)    All other components within normal limits  CBC  POC URINE PREG, ED  TROPONIN I (HIGH SENSITIVITY)  TROPONIN I (HIGH SENSITIVITY)     EKG  My interpretation of EKG:  Sinus tachycardia rate of 108 without any ST elevations, T wave inversions in the inferior lateral leads, normal intervals  Reviewed prior EKG from December 2020 where she had similar T wave inversions  RADIOLOGY I have reviewed the xray personally and interpreted and no pneumonia  PROCEDURES:  Critical Care performed: No  Procedures   MEDICATIONS ORDERED IN ED: Medications  lidocaine (LIDODERM) 5 % 1 patch (1 patch Transdermal Patch Applied 03/08/22 2057)  sodium chloride 0.9 % bolus 500  mL (500 mLs Intravenous New Bag/Given 03/08/22 2057)  ketorolac (TORADOL) 15 MG/ML injection 15 mg (15 mg Intravenous Given 03/08/22 2054)     IMPRESSION / MDM / ASSESSMENT AND PLAN / ED COURSE  I reviewed the triage vital signs and the nursing notes.   Patient's presentation is most consistent with acute presentation with potential threat to life or bodily function.   Patient comes in with some chest tightness that started today.  Unable to [facility] out given tachycardia will get D-dimer.  Will get COVID, flu will get repeat troponin.  Seems unlikely to be stable angina.  We discussed admission for stress test if patient's story was consistent with angina or high risk for this but story does not really sound consistent with it.  She does have a high heart score but she would feel comfortable following up outpatient if workup is otherwise reassuring.  Pregnancy test was negative.  CBC reassuring BMP reassuring.  Troponin negative  D-dimer was  negative.  Pregnancy test negative.  Troponins negative x 2.  On repeat assessment patient reports pain is better the minimal pain that she has is a 1 out of 10 and is only when you push on her chest wall.  We discussed admission versus going home but given unchanged EKG troponins negative x 2 and her symptoms do not really sound consistent with angina or cardiac in nature she feels comfortable and prefer to be discharged home and will follow-up outpatient with her cardiologist.  She understands that she does have heart disease and i that I cannot predict future heart attack so if she does have change in her symptoms that she would need to return to the ER immediately for repeat evaluation.     FINAL CLINICAL IMPRESSION(S) / ED DIAGNOSES   Final diagnoses:  Atypical chest pain  Viral illness     Rx / DC Orders   ED Discharge Orders     None        Note:  This document was prepared using Dragon voice recognition software and may include unintentional dictation errors.   Concha Se, MD 03/08/22 [phone]

## 2023-11-15 ENCOUNTER — Other Ambulatory Visit: Payer: Self-pay

## 2023-11-15 DIAGNOSIS — Z1231 Encounter for screening mammogram for malignant neoplasm of breast: Secondary | ICD-10-CM

## 2023-12-05 ENCOUNTER — Other Ambulatory Visit: Payer: Self-pay | Admitting: Family Medicine

## 2023-12-05 DIAGNOSIS — D352 Benign neoplasm of pituitary gland: Secondary | ICD-10-CM

## 2023-12-15 ENCOUNTER — Ambulatory Visit
Admission: RE | Admit: 2023-12-15 | Discharge: 2023-12-15 | Disposition: A | Discharge status: Home / Self Care | Source: Ambulatory Visit | Attending: Family Medicine | Admitting: Family Medicine

## 2023-12-15 DIAGNOSIS — D352 Benign neoplasm of pituitary gland: Secondary | ICD-10-CM | POA: Insufficient documentation

## 2023-12-15 MED ORDER — GADOBUTROL 1 MMOL/ML IV SOLN
9.0000 mL | Freq: Once | INTRAVENOUS | Status: AC | PRN
  Administered 2023-12-15: 9 mL via INTRAVENOUS

## 2024-01-16 ENCOUNTER — Other Ambulatory Visit
Admission: RE | Admit: 2024-01-16 | Discharge: 2024-01-16 | Disposition: A | Discharge status: Home / Self Care | Source: Ambulatory Visit

## 2024-01-16 DIAGNOSIS — D352 Benign neoplasm of pituitary gland: Secondary | ICD-10-CM | POA: Insufficient documentation

## 2024-01-16 LAB — HCG, QUANTITATIVE, PREGNANCY: hCG, Beta Chain, Quant, S: <1 m[IU]/mL (ref <5)
